# Patient Record
Sex: Male | Born: 1960 | Race: White | Hispanic: No | Smoking: Never smoker
Health system: Southern US, Community
[De-identification: ages and names within clinical notes are randomized; demographics above are authoritative.]

## PROBLEM LIST (undated history)

## (undated) DIAGNOSIS — E119 Type 2 diabetes mellitus without complications: Secondary | ICD-10-CM

## (undated) DIAGNOSIS — I509 Heart failure, unspecified: Secondary | ICD-10-CM

## (undated) DIAGNOSIS — M1990 Unspecified osteoarthritis, unspecified site: Secondary | ICD-10-CM

## (undated) DIAGNOSIS — I10 Essential (primary) hypertension: Secondary | ICD-10-CM

## (undated) HISTORY — PX: SPLENECTOMY, TOTAL: SHX788

## (undated) HISTORY — PX: TONSILLECTOMY: SUR1361

---

## 2018-02-04 DIAGNOSIS — M75100 Unspecified rotator cuff tear or rupture of unspecified shoulder, not specified as traumatic: Secondary | ICD-10-CM | POA: Insufficient documentation

## 2018-02-04 DIAGNOSIS — M25519 Pain in unspecified shoulder: Secondary | ICD-10-CM | POA: Insufficient documentation

## 2018-02-27 DIAGNOSIS — M5412 Radiculopathy, cervical region: Secondary | ICD-10-CM | POA: Insufficient documentation

## 2019-05-07 ENCOUNTER — Emergency Department: Payer: Medicare Other

## 2019-05-07 ENCOUNTER — Other Ambulatory Visit: Payer: Self-pay

## 2019-05-07 ENCOUNTER — Encounter: Payer: Self-pay | Admitting: Emergency Medicine

## 2019-05-07 ENCOUNTER — Emergency Department
Admission: EM | Admit: 2019-05-07 | Discharge: 2019-05-07 | Disposition: A | Discharge status: Home / Self Care | Payer: Medicare Other | Attending: Emergency Medicine | Admitting: Emergency Medicine

## 2019-05-07 DIAGNOSIS — R1030 Lower abdominal pain, unspecified: Secondary | ICD-10-CM | POA: Insufficient documentation

## 2019-05-07 DIAGNOSIS — R112 Nausea with vomiting, unspecified: Secondary | ICD-10-CM | POA: Diagnosis present

## 2019-05-07 DIAGNOSIS — R1013 Epigastric pain: Secondary | ICD-10-CM | POA: Diagnosis not present

## 2019-05-07 DIAGNOSIS — R1084 Generalized abdominal pain: Secondary | ICD-10-CM

## 2019-05-07 DIAGNOSIS — I110 Hypertensive heart disease with heart failure: Secondary | ICD-10-CM | POA: Diagnosis not present

## 2019-05-07 DIAGNOSIS — D7389 Other diseases of spleen: Secondary | ICD-10-CM

## 2019-05-07 DIAGNOSIS — I509 Heart failure, unspecified: Secondary | ICD-10-CM | POA: Insufficient documentation

## 2019-05-07 DIAGNOSIS — R1032 Left lower quadrant pain: Secondary | ICD-10-CM | POA: Diagnosis not present

## 2019-05-07 HISTORY — DX: Type 2 diabetes mellitus without complications: E11.9

## 2019-05-07 HISTORY — DX: Heart failure, unspecified: I50.9

## 2019-05-07 HISTORY — DX: Essential (primary) hypertension: I10

## 2019-05-07 LAB — COMPREHENSIVE METABOLIC PANEL
ALT: 14 U/L (ref 0–44)
AST: 19 U/L (ref 15–41)
Albumin: 3.9 g/dL (ref 3.5–5.0)
Alkaline Phosphatase: 101 U/L (ref 38–126)
Anion gap: 9 (ref 5–15)
BUN: 13 mg/dL (ref 6–20)
CO2: 26 mmol/L (ref 22–32)
Calcium: 8.6 mg/dL — ABNORMAL LOW (ref 8.9–10.3)
Chloride: 98 mmol/L (ref 98–111)
Creatinine, Ser: 0.98 mg/dL (ref 0.61–1.24)
GFR calc Af Amer: >60 mL/min (ref >60)
GFR calc non Af Amer: >60 mL/min (ref >60)
Glucose, Bld: 248 mg/dL — ABNORMAL HIGH (ref 70–99)
Potassium: 3.6 mmol/L (ref 3.5–5.1)
Sodium: 133 mmol/L — ABNORMAL LOW (ref 135–145)
Total Bilirubin: 1.3 mg/dL — ABNORMAL HIGH (ref 0.3–1.2)
Total Protein: 7.6 g/dL (ref 6.5–8.1)

## 2019-05-07 LAB — CBC WITH DIFFERENTIAL/PLATELET
Abs Immature Granulocytes: 0.02 10*3/uL (ref 0.00–0.07)
Basophils Absolute: 0 10*3/uL (ref 0.0–0.1)
Basophils Relative: 0 %
Eosinophils Absolute: 0 10*3/uL (ref 0.0–0.5)
Eosinophils Relative: 0 %
HCT: 44 % (ref 39.0–52.0)
Hemoglobin: 14.8 g/dL (ref 13.0–17.0)
Immature Granulocytes: 0 %
Lymphocytes Relative: 10 %
Lymphs Abs: 1 10*3/uL (ref 0.7–4.0)
MCH: 30.9 pg (ref 26.0–34.0)
MCHC: 33.6 g/dL (ref 30.0–36.0)
MCV: 91.9 fL (ref 80.0–100.0)
Monocytes Absolute: 0.5 10*3/uL (ref 0.1–1.0)
Monocytes Relative: 5 %
Neutro Abs: 7.9 10*3/uL — ABNORMAL HIGH (ref 1.7–7.7)
Neutrophils Relative %: 85 %
Platelets: 278 10*3/uL (ref 150–400)
RBC: 4.79 MIL/uL (ref 4.22–5.81)
RDW: 12.8 % (ref 11.5–15.5)
WBC: 9.4 10*3/uL (ref 4.0–10.5)
nRBC: 0 % (ref 0.0–0.2)

## 2019-05-07 LAB — BRAIN NATRIURETIC PEPTIDE: B Natriuretic Peptide: 438 pg/mL — ABNORMAL HIGH (ref 0.0–100.0)

## 2019-05-07 LAB — TROPONIN I: Troponin I: <0.03 ng/mL (ref <0.03)

## 2019-05-07 LAB — LIPASE, BLOOD: Lipase: 44 U/L (ref 11–51)

## 2019-05-07 MED ORDER — IOHEXOL 300 MG/ML  SOLN
100.0000 mL | Freq: Once | INTRAMUSCULAR | Status: AC | PRN
  Administered 2019-05-07: 100 mL via INTRAVENOUS

## 2019-05-07 MED ORDER — SODIUM CHLORIDE 0.9 % IV BOLUS
1000.0000 mL | Freq: Once | INTRAVENOUS | Status: AC
  Administered 2019-05-07: 1000 mL via INTRAVENOUS

## 2019-05-07 MED ORDER — IOHEXOL 240 MG/ML SOLN
50.0000 mL | Freq: Once | INTRAMUSCULAR | Status: AC
  Administered 2019-05-07: 50 mL via INTRAVENOUS

## 2019-05-07 MED ORDER — ONDANSETRON HCL 4 MG/2ML IJ SOLN
4.0000 mg | Freq: Once | INTRAMUSCULAR | Status: AC
  Administered 2019-05-07: 4 mg via INTRAVENOUS
  Filled 2019-05-07: qty 2

## 2019-05-07 MED ORDER — OXYCODONE-ACETAMINOPHEN 5-325 MG PO TABS: 1.0000 | ORAL_TABLET | ORAL | 0 refills | Status: DC | PRN

## 2019-05-07 MED ORDER — ONDANSETRON 4 MG PO TBDP: 4.0000 mg | ORAL_TABLET | Freq: Three times a day (TID) | ORAL | 0 refills | Status: DC | PRN

## 2019-05-07 MED ORDER — MORPHINE SULFATE (PF) 4 MG/ML IV SOLN
4.0000 mg | Freq: Once | INTRAVENOUS | Status: AC
  Administered 2019-05-07: 4 mg via INTRAVENOUS
  Filled 2019-05-07: qty 1

## 2019-05-07 NOTE — ED Triage Notes (Signed)
Patient ambulatory to triage with steady gait, without difficulty or distress noted; pt reports since last night having mid CP, generalized abd pain accomp by N/V

## 2019-05-07 NOTE — Discharge Instructions (Addendum)
You have been seen in the emergency department for abdominal pain nausea and vomiting.  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  Please take your pain and nausea medication as needed, as prescribed.  Return to the emergency department for any worsening abdominal pain, development of fever, or any other symptom personally concerning to yourself.

## 2019-05-07 NOTE — ED Provider Notes (Signed)
[facility] Emergency Department Provider Note  Time seen: 8:43 AM  I have reviewed the triage vital signs and the nursing notes.   HISTORY  Chief Complaint Chest Pain and Abdominal Pain   HPI Micheal Hardin is a 58 y.o. male with a past medical history of CHF, hypertension, presents to the emergency department for nausea vomiting.  According to the patient at around 11 PM last night he became very nauseated and began with frequent episodes of vomiting.  Patient states intermittent vomiting throughout the night, states mild discomfort in his mid chest and upper abdomen which she relates to vomiting.  Denies any shortness of breath.  Patient states he was diagnosed with pneumonia approximately 1 week ago and completed a course of antibiotics, states this has improved.  States he was also swab for coronavirus 1 week ago which was negative.  Patient states moderate upper abdominal pain with mild lower abdominal pain.  States a small bowel movement this morning.  Denies any diarrhea.  Denies any fever.  Past Medical History:  Diagnosis Date  . CHF (congestive heart failure) ([place])   . Hypertension     There are no active problems to display for this patient.    Prior to Admission medications   Not on File    No Known Allergies  No family history on file.  Social History Social History   Tobacco Use  . Smoking status: Never Smoker  . Smokeless tobacco: Never Used  Substance Use Topics  . Alcohol use: Not on file  . Drug use: Not on file    Review of Systems Constitutional: Negative for fever. ENT: Negative for recent illness/congestion Cardiovascular: Mild central chest discomfort which he relates to vomiting Respiratory: Negative for shortness of breath.  Mild cough, recently diagnosed with pneumonia. Gastrointestinal: Upper and lower abdominal pain.  Positive for nausea vomiting.  Negative for diarrhea. Genitourinary: Negative for urinary  compaints Musculoskeletal: Negative for musculoskeletal complaints Skin: Negative for skin complaints  Neurological: Negative for headache All other ROS negative  ____________________________________________   PHYSICAL EXAM:  VITAL SIGNS: ED Triage Vitals  Enc Vitals Group     BP 05/07/19 0604 (!) 143/101     Pulse Rate 05/07/19 0604 (!) 116     Resp 05/07/19 0604 (!) 27     Temp 05/07/19 0604 97.6 F (36.4 C)     Temp src --      SpO2 05/07/19 0604 98 %     Weight 05/07/19 0555 167 lb (75.8 kg)     Height 05/07/19 0555 5\' 7"  (1.702 m)     Head Circumference --      Peak Flow --      Pain Score 05/07/19 0555 8     Pain Loc --      Pain Edu? --      Excl. in [place]? --    Constitutional: Alert and oriented. Well appearing and in no distress. Eyes: Normal exam ENT      Head: Normocephalic and atraumatic.      Mouth/Throat: Mucous membranes are moist. Cardiovascular: Normal rate, regular rhythm. No murmur Respiratory: Normal respiratory effort without tachypnea nor retractions. Breath sounds are clear  Gastrointestinal: Soft, moderate epigastric tenderness palpation with mild left lower quadrant and suprapubic tenderness.  No rebound guarding or distention. Musculoskeletal: Nontender with normal range of motion in all extremities.  Neurologic:  Normal speech and language. No gross focal neurologic deficits  Skin:  Skin is warm, dry and intact.  Psychiatric: Mood and affect are normal.   ____________________________________________    EKG  EKG viewed and interpreted by myself shows sinus tachycardia at 115 bpm with a slightly widened QRS, left axis deviation, largely normal intervals nonspecific ST changes.  ____________________________________________    RADIOLOGY   IMPRESSION:  1. Small bilateral pleural effusions and septal thickening in the  visualized lung bases suggesting CHF.  2. 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 Comparison with prior outside  studies if available would be useful to confirm. Alternatively,  Tc-55m sulfur colloid scintigraphy can also be diagnostic.  3. 1.5 cm left adrenal nodule, possibly adenoma but nonspecific.   ____________________________________________   INITIAL IMPRESSION / ASSESSMENT AND PLAN / ED COURSE  Pertinent labs & imaging results that were available during my care of the patient were reviewed by me and considered in my medical decision making (see chart for details).   Patient presents emergency department for abdominal discomfort, mild chest discomfort and nausea vomiting since 11 PM last night.  Differential is quite broad but would include gastritis, gastroenteritis, enteritis, ACS, pancreatitis, colitis or diverticulitis, SBO.  We will treat pain, nausea, IV hydrate.  Patient's lab work is largely nonrevealing, negative troponin.  Lipase has been added.  CT pending.  CT shows possible splenosis which could be responsible for the patient's abdominal pain.  We will have the patient follow-up with his PCP regarding the results.  Overall patient appears well, pain is improved.  We will discharge home pain medication.  Patient agreeable to plan of care.  Discussed my normal abdominal pain return precautions.  Micheal Hardin was evaluated in Emergency Department on 05/07/2019 for the symptoms described in the history of present illness. He was evaluated in the context of the global COVID-19 pandemic, which necessitated consideration that the patient might be at risk for infection with the SARS-CoV-2 virus that causes COVID-19. Institutional protocols and algorithms that pertain to the evaluation of patients at risk for COVID-19 are in a state of rapid change based on information released by regulatory bodies including the CDC and federal and state organizations. These policies and algorithms were followed during the patient's care in the  ED.  ____________________________________________   FINAL CLINICAL IMPRESSION(S) / ED DIAGNOSES  Abdominal pain Nausea vomiting   Minna Antis, MD 05/07/19 1120

## 2019-05-08 ENCOUNTER — Other Ambulatory Visit: Payer: Self-pay

## 2019-05-08 ENCOUNTER — Observation Stay
Admission: EM | Admit: 2019-05-08 | Discharge: 2019-05-10 | Disposition: A | Discharge status: Home / Self Care | Payer: Medicare Other | Attending: Internal Medicine | Admitting: Internal Medicine

## 2019-05-08 ENCOUNTER — Emergency Department: Payer: Medicare Other

## 2019-05-08 DIAGNOSIS — F329 Major depressive disorder, single episode, unspecified: Secondary | ICD-10-CM | POA: Insufficient documentation

## 2019-05-08 DIAGNOSIS — G629 Polyneuropathy, unspecified: Secondary | ICD-10-CM | POA: Diagnosis not present

## 2019-05-08 DIAGNOSIS — I081 Rheumatic disorders of both mitral and tricuspid valves: Secondary | ICD-10-CM | POA: Diagnosis not present

## 2019-05-08 DIAGNOSIS — M549 Dorsalgia, unspecified: Secondary | ICD-10-CM | POA: Diagnosis not present

## 2019-05-08 DIAGNOSIS — R112 Nausea with vomiting, unspecified: Secondary | ICD-10-CM | POA: Diagnosis not present

## 2019-05-08 DIAGNOSIS — N400 Enlarged prostate without lower urinary tract symptoms: Secondary | ICD-10-CM | POA: Diagnosis not present

## 2019-05-08 DIAGNOSIS — I5022 Chronic systolic (congestive) heart failure: Secondary | ICD-10-CM | POA: Diagnosis not present

## 2019-05-08 DIAGNOSIS — G8929 Other chronic pain: Secondary | ICD-10-CM | POA: Insufficient documentation

## 2019-05-08 DIAGNOSIS — Z9081 Acquired absence of spleen: Secondary | ICD-10-CM | POA: Insufficient documentation

## 2019-05-08 DIAGNOSIS — J90 Pleural effusion, not elsewhere classified: Secondary | ICD-10-CM | POA: Diagnosis not present

## 2019-05-08 DIAGNOSIS — I110 Hypertensive heart disease with heart failure: Secondary | ICD-10-CM | POA: Diagnosis not present

## 2019-05-08 DIAGNOSIS — Z79899 Other long term (current) drug therapy: Secondary | ICD-10-CM | POA: Diagnosis not present

## 2019-05-08 DIAGNOSIS — E119 Type 2 diabetes mellitus without complications: Secondary | ICD-10-CM | POA: Insufficient documentation

## 2019-05-08 DIAGNOSIS — K219 Gastro-esophageal reflux disease without esophagitis: Secondary | ICD-10-CM | POA: Insufficient documentation

## 2019-05-08 DIAGNOSIS — Z8249 Family history of ischemic heart disease and other diseases of the circulatory system: Secondary | ICD-10-CM | POA: Diagnosis not present

## 2019-05-08 DIAGNOSIS — Z1159 Encounter for screening for other viral diseases: Secondary | ICD-10-CM | POA: Diagnosis not present

## 2019-05-08 LAB — CBC
HCT: 44.7 % (ref 39.0–52.0)
Hemoglobin: 15 g/dL (ref 13.0–17.0)
MCH: 30.5 pg (ref 26.0–34.0)
MCHC: 33.6 g/dL (ref 30.0–36.0)
MCV: 91 fL (ref 80.0–100.0)
Platelets: 265 10*3/uL (ref 150–400)
RBC: 4.91 MIL/uL (ref 4.22–5.81)
RDW: 12.8 % (ref 11.5–15.5)
WBC: 8 10*3/uL (ref 4.0–10.5)
nRBC: 0 % (ref 0.0–0.2)

## 2019-05-08 LAB — COMPREHENSIVE METABOLIC PANEL
ALT: 14 U/L (ref 0–44)
AST: 24 U/L (ref 15–41)
Albumin: 3.9 g/dL (ref 3.5–5.0)
Alkaline Phosphatase: 99 U/L (ref 38–126)
Anion gap: 12 (ref 5–15)
BUN: 10 mg/dL (ref 6–20)
CO2: 23 mmol/L (ref 22–32)
Calcium: 8.9 mg/dL (ref 8.9–10.3)
Chloride: 100 mmol/L (ref 98–111)
Creatinine, Ser: 0.91 mg/dL (ref 0.61–1.24)
GFR calc Af Amer: >60 mL/min (ref >60)
GFR calc non Af Amer: >60 mL/min (ref >60)
Glucose, Bld: 210 mg/dL — ABNORMAL HIGH (ref 70–99)
Potassium: 4 mmol/L (ref 3.5–5.1)
Sodium: 135 mmol/L (ref 135–145)
Total Bilirubin: 1.8 mg/dL — ABNORMAL HIGH (ref 0.3–1.2)
Total Protein: 7.6 g/dL (ref 6.5–8.1)

## 2019-05-08 LAB — LIPASE, BLOOD: Lipase: 32 U/L (ref 11–51)

## 2019-05-08 LAB — URINALYSIS, COMPLETE (UACMP) WITH MICROSCOPIC
Bacteria, UA: NONE SEEN
Bilirubin Urine: NEGATIVE
Glucose, UA: 50 mg/dL — AB
Hgb urine dipstick: NEGATIVE
Ketones, ur: 5 mg/dL — AB
Leukocytes,Ua: NEGATIVE
Nitrite: NEGATIVE
Protein, ur: NEGATIVE mg/dL
Specific Gravity, Urine: 1.008 (ref 1.005–1.030)
Squamous Epithelial / HPF: NONE SEEN (ref 0–5)
pH: 7 (ref 5.0–8.0)

## 2019-05-08 MED ORDER — ACETAMINOPHEN 325 MG PO TABS: 650.0000 mg | ORAL_TABLET | Freq: Four times a day (QID) | ORAL | Status: DC | PRN

## 2019-05-08 MED ORDER — OXYCODONE-ACETAMINOPHEN 5-325 MG PO TABS: 1.0000 | ORAL_TABLET | ORAL | Status: DC | PRN

## 2019-05-08 MED ORDER — FUROSEMIDE 40 MG PO TABS
80.0000 mg | ORAL_TABLET | Freq: Every day | ORAL | Status: DC
  Administered 2019-05-09 – 2019-05-10 (×2): 80 mg via ORAL
  Filled 2019-05-08 (×2): qty 2

## 2019-05-08 MED ORDER — ACETAMINOPHEN 650 MG RE SUPP
650.0000 mg | Freq: Four times a day (QID) | RECTAL | Status: DC | PRN
  Filled 2019-05-08: qty 1

## 2019-05-08 MED ORDER — HYDROXYZINE HCL 50 MG PO TABS
25.0000 mg | ORAL_TABLET | Freq: Three times a day (TID) | ORAL | Status: DC
  Administered 2019-05-09 – 2019-05-10 (×6): 25 mg via ORAL
  Filled 2019-05-08 (×8): qty 1

## 2019-05-08 MED ORDER — ONDANSETRON HCL 4 MG/2ML IJ SOLN
4.0000 mg | Freq: Once | INTRAMUSCULAR | Status: AC
  Administered 2019-05-08: 4 mg via INTRAVENOUS
  Filled 2019-05-08: qty 2

## 2019-05-08 MED ORDER — SODIUM CHLORIDE 0.9 % IV BOLUS
250.0000 mL | Freq: Once | INTRAVENOUS | Status: AC
  Administered 2019-05-08: 250 mL via INTRAVENOUS

## 2019-05-08 MED ORDER — SODIUM CHLORIDE 0.9 % IV BOLUS
500.0000 mL | Freq: Once | INTRAVENOUS | Status: AC
  Administered 2019-05-08: 500 mL via INTRAVENOUS

## 2019-05-08 MED ORDER — METOCLOPRAMIDE HCL 5 MG/ML IJ SOLN
5.0000 mg | Freq: Four times a day (QID) | INTRAMUSCULAR | Status: DC
  Administered 2019-05-09 – 2019-05-10 (×7): 5 mg via INTRAVENOUS
  Filled 2019-05-08 (×7): qty 2

## 2019-05-08 MED ORDER — ENOXAPARIN SODIUM 40 MG/0.4ML ~~LOC~~ SOLN
40.0000 mg | SUBCUTANEOUS | Status: DC
  Administered 2019-05-09 – 2019-05-10 (×2): 40 mg via SUBCUTANEOUS
  Filled 2019-05-08 (×2): qty 0.4

## 2019-05-08 MED ORDER — MAGNESIUM HYDROXIDE 400 MG/5ML PO SUSP: 30.0000 mL | Freq: Every day | ORAL | Status: DC | PRN

## 2019-05-08 MED ORDER — ONDANSETRON 4 MG PO TBDP: 4.0000 mg | ORAL_TABLET | Freq: Three times a day (TID) | ORAL | Status: DC | PRN

## 2019-05-08 MED ORDER — TRAZODONE HCL 50 MG PO TABS
25.0000 mg | ORAL_TABLET | Freq: Every evening | ORAL | Status: DC | PRN
  Filled 2019-05-08: qty 0.5

## 2019-05-08 MED ORDER — PROMETHAZINE HCL 25 MG/ML IJ SOLN
6.2500 mg | Freq: Once | INTRAMUSCULAR | Status: AC
  Administered 2019-05-08: 6.25 mg via INTRAMUSCULAR
  Filled 2019-05-08: qty 1

## 2019-05-08 MED ORDER — ONDANSETRON HCL 4 MG/2ML IJ SOLN: 4.0000 mg | Freq: Four times a day (QID) | INTRAMUSCULAR | Status: DC | PRN

## 2019-05-08 MED ORDER — ESCITALOPRAM OXALATE 10 MG PO TABS
10.0000 mg | ORAL_TABLET | Freq: Every day | ORAL | Status: DC
  Administered 2019-05-09 – 2019-05-10 (×2): 10 mg via ORAL
  Filled 2019-05-08 (×2): qty 1

## 2019-05-08 MED ORDER — ONDANSETRON HCL 4 MG PO TABS
4.0000 mg | ORAL_TABLET | Freq: Four times a day (QID) | ORAL | Status: DC | PRN
  Administered 2019-05-09: 4 mg via ORAL
  Filled 2019-05-08 (×2): qty 1

## 2019-05-08 NOTE — H&P (Signed)
[place] [facility] [place] NAME: Micheal Hardin    MR#:  [id number]  DATE OF BIRTH:  1961-04-25  DATE OF ADMISSION:  05/08/2019  PRIMARY CARE PHYSICIAN: Patient, No Pcp Per   REQUESTING/REFERRING PHYSICIAN: Delman Kitten, MD  CHIEF COMPLAINT:   Chief Complaint  Patient presents with   Nausea   Emesis    HISTORY OF PRESENT ILLNESS:  Micheal Hardin  is a 58 y.o. Caucasian male with a known history of type diabetes mellitus, hypertension CHF and depression, who presented to the emergency room with acute onset of intractable nausea and vomiting since Thursday.  This is a second visit in the last 24 hours to this ER.  He admitted to epigastric abdominal pain and denied any bilious vomitus or hematemesis.  No diarrhea.  No fever or chills.  He was recently treated for pneumonia about a week ago his dyspnea and cough have been better.  He denies any worsening lower extremity edema.  He admitted to occasional paroxysmal nocturnal dyspnea but denied any orthopnea.  His abdominal pelvic CT scan yesterday showed small bilateral pleural effusion suggestive of CHF as well as splenosis as he is status post splenectomy and 1.5 cm left adrenal nodule likely adenoma that is nonspecific.  When he came to the ER tonight, his blood pressure was 144/104 with a pulse 112 respiratory 26 pulse ox 99% on room air.  His temperature was 97.6.  Labs revealed blood glucose of 210 and lipase of 32 with total bili of 1.8 and otherwise normal LFTs.  CBC was within normal.  His urinalysis was unremarkable except for glucose of 50 and ketones of 5. Two-view abdomen x-ray revealed nonobstructive bowel gas pattern with contrast in the colon and small left pleural effusion.  EKG showed sinus tachycardia with rate of 112 with poor R wave progression and suspected left bundle branch block.  The patient was given 4 mg of IV Zofran and 6.25 mg of IM Phenergan twice in addition to 750 mL  IV normal saline bolus.  He failed p.o. challenge.  He will be admitted to an observation medical bed for further evaluation and management. PAST MEDICAL HISTORY:   Past Medical History:  Diagnosis Date   CHF (congestive heart failure) ([facility])    Diabetes mellitus without complication ([facility])    Hypertension   Depression, GERD, chronic back pain and peripheral neuropathy  PAST SURGICAL HISTORY:   Past Surgical History:  Procedure Laterality Date   SPLENECTOMY, TOTAL     TONSILLECTOMY    Cardiac catheterization  SOCIAL HISTORY:   Social History   Tobacco Use   Smoking status: Never Smoker   Smokeless tobacco: Never Used  Substance Use Topics   Alcohol use: Not on file    FAMILY HISTORY:  Positive for diabetes mellitus in his father and liver cancer in his mother as well as heart failure in his brother.  DRUG ALLERGIES:  No Known Allergies  REVIEW OF SYSTEMS:   ROS As per history of present illness. All pertinent systems were reviewed above. Constitutional,  HEENT, cardiovascular, respiratory, GI, GU, musculoskeletal, neuro, psychiatric, endocrine,  integumentary and hematologic systems were reviewed and are otherwise  negative/unremarkable except for positive findings mentioned above in the HPI.   MEDICATIONS AT HOME:   Prior to Admission medications   Medication Sig Start Date End Date Taking? Authorizing Provider  escitalopram (LEXAPRO) 10 MG tablet Take 10 mg by mouth daily. 04/19/19   [provider]  furosemide (LASIX) 40 MG tablet Take 80 mg by mouth daily.    [provider]  hydrOXYzine (VISTARIL) 25 MG capsule Take 25 mg by mouth 3 (three) times daily. 05/05/19   [provider]  ondansetron (ZOFRAN ODT) 4 MG disintegrating tablet Take 1 tablet (4 mg total) by mouth every 8 (eight) hours as needed for nausea or vomiting. 05/07/19   Minna AntisPaduchowski, Kevin, MD  oxyCODONE-acetaminophen (PERCOCET) 5-325 MG tablet Take 1 tablet by mouth  every 4 (four) hours as needed for severe pain. 05/07/19   Minna AntisPaduchowski, Kevin, MD      VITAL SIGNS:  Blood pressure (!) 139/117, pulse (!) 112, temperature 98 F (36.7 C), temperature source Oral, resp. rate (!) 23, height 5\' 7"  (1.702 m), weight 75 kg, SpO2 95 %.  PHYSICAL EXAMINATION:  Physical Exam  GENERAL:  58 y.o.-year-old Caucasian patient lying in the bed with no acute distress.  EYES: Pupils equal, round, reactive to light and accommodation. No scleral icterus. Extraocular muscles intact.  HEENT: Head atraumatic, normocephalic. Oropharynx and nasopharynx clear.  NECK:  Supple, no jugular venous distention. No thyroid enlargement, no tenderness.  LUNGS: Diminished basal breath sounds bilaterally, no wheezing, rales,rhonchi or crepitation. No use of accessory muscles of respiration.  CARDIOVASCULAR: Regular rate and rhythm, S1, S2 normal. No murmurs, rubs, or gallops.  ABDOMEN: Soft, nondistended, nontender. Bowel sounds present. No organomegaly or mass.  EXTREMITIES: Trace bilateral lower extremity pitting edema, with no cyanosis, or clubbing.  NEUROLOGIC: Cranial nerves II through XII are intact. Muscle strength 5/5 in all extremities. Sensation intact. Gait not checked.  PSYCHIATRIC: The patient is alert and oriented x 3.  Normal affect and good eye contact. SKIN: No obvious rash, lesion, or ulcer.   LABORATORY PANEL:   CBC Recent Labs  Lab 05/08/19 1834  WBC 8.0  HGB 15.0  HCT 44.7  PLT 265   ------------------------------------------------------------------------------------------------------------------  Chemistries  Recent Labs  Lab 05/08/19 1834  NA 135  K 4.0  CL 100  CO2 23  GLUCOSE 210*  BUN 10  CREATININE 0.91  CALCIUM 8.9  AST 24  ALT 14  ALKPHOS 99  BILITOT 1.8*   ------------------------------------------------------------------------------------------------------------------  Cardiac Enzymes Recent Labs  Lab 05/07/19 0609  TROPONINI  <0.03   ------------------------------------------------------------------------------------------------------------------  RADIOLOGY:  Ct Abdomen Pelvis W Contrast  Result Date: 05/07/2019 CLINICAL DATA:  Patient ambulatory to triage with steady gait, without difficulty or distress noted; pt reports since last night having mid CP, generalized abd pain accomp by N/V. History of splenectomy EXAM: CT ABDOMEN AND PELVIS WITH CONTRAST TECHNIQUE: Multidetector CT imaging of the abdomen and pelvis was performed using the standard protocol following bolus administration of intravenous contrast. CONTRAST:  100mL OMNIPAQUE IOHEXOL 300 MG/ML  SOLN COMPARISON:  None. FINDINGS: Lower chest: Small bilateral pleural effusions. Septal thickening in the visualized lung bases. Hepatobiliary: No focal liver abnormality is seen. No gallstones, gallbladder wall thickening, or biliary dilatation. Pancreas: Unremarkable. No pancreatic ductal dilatation or surrounding inflammatory changes. Spleen: Surgically absent. However, there are multiple homogeneously enhancing somewhat lobular abdominal and right pelvic peritoneal masses measuring from 1.3 to 7.3 cm in diameter suggesting posttraumatic or postop heterotopic autotransplantation of splenic tissue. Adrenals/Urinary Tract: 1.5 cm low-attenuation left adrenal nodule, possibly adenoma but nonspecific. Right adrenal unremarkable. No renal mass or hydronephrosis. Urinary bladder physiologically distended. Stomach/Bowel: Stomach is nondilated. Small bowel decompressed. The colon is nondilated, unremarkable. Vascular/Lymphatic: Mild scattered aortoiliac calcified atheromatous plaque without aneurysm or evident stenosis. No abdominal or pelvic adenopathy. Reproductive: Prostate  enlargement with central coarse calcifications. Other: No ascites. No free air. Musculoskeletal: Advanced spondylitic changes in the lower lumbar spine. No fracture or worrisome bone lesion. IMPRESSION: 1. Small  bilateral pleural effusions and septal thickening in the visualized lung bases suggesting CHF. 2. Multiple homogeneously enhancing peritoneal masses post splenectomy, suggesting splenosis. Comparison with prior outside studies if available would be useful to confirm. Alternatively, Tc-19m sulfur colloid scintigraphy can also be diagnostic. 3. 1.5 cm left adrenal nodule, possibly adenoma but nonspecific. Electronically Signed   By: Corlis Leak M.D.   On: 05/07/2019 10:58   Dg Abd 2 Views  Result Date: 05/08/2019 CLINICAL DATA:  Nausea vomiting and fatigue EXAM: ABDOMEN - 2 VIEW COMPARISON:  CT 05/07/2019 FINDINGS: Linear atelectasis left base. Small left effusion. Enteral contrast within the colon. Nonobstructed bowel-gas pattern. IMPRESSION: 1. Nonobstructed bowel gas pattern with contrast in the colon 2. Small left effusion Electronically Signed   By: Jasmine Pang M.D.   On: 05/08/2019 19:45      IMPRESSION AND PLAN:   1.  Nausea and vomiting.  This could be related to mild gastritis possibly viral.  The patient will be admitted to an elevation medical bed.  He will be hydrated with IV normal saline.  Will place on clear liquids.  PRN antiemetics will be provided and will place him on scheduled IV Reglan given his history of type 2 diabetes mellitus and the possibility of gastroparesis in the differential diagnosis.  IV PPI therapy will be provided.  2.  Chronic CHF.  He does not have clear physical signs of acute CHF.  For now we will continue his Lasix and avoid IV fluids.  Given his bilateral pleural effusion I will obtain a 2D echo to assess his current EF.  3.  Type II diabetes mellitus.  The patient will be placed on supplemental coverage with NovoLog.  4.  Hypertension.  We will continue his antihypertensives.  5.  Depression.  His Lexapr, doxepin and Wellbutrin ER will be continued.  6.  GERD.  He will be placed on IV PPI therapy.  We will continue his Carafate.  7.  Peripheral  neuropathy.  Lyrica will be resumed.  8.  DVT prophylaxis.  Subcutaneous Lovenox   All the records are reviewed and case discussed with ED provider. The plan of care was discussed in details with the patient (and family). I answered all questions. The patient agreed to proceed with the above mentioned plan. Further management will depend upon hospital course.   CODE STATUS: Full code  TOTAL TIME TAKING CARE OF THIS PATIENT: 50 minutes.    Hannah Beat M.D on 05/08/2019 at 11:54 PM  Pager - [phone]  After 6pm go to [email or web address] - Social research officer, government  [facility]  Office  [phone]  CC: Primary care physician; Patient, No Pcp Per   Note: This dictation was prepared with Dragon dictation along with smaller phrase technology. Any transcriptional errors that result from this process are unintentional.

## 2019-05-08 NOTE — ED Provider Notes (Signed)
[facility] Emergency Department Provider Note   ____________________________________________   First MD Initiated Contact with Patient 05/08/19 1901     (approximate)  I have reviewed the triage vital signs and the nursing notes.   HISTORY  Chief Complaint Nausea and Emesis    HPI Micheal Hardin is a 58 y.o. male with a history of CHF diabetes and recent evaluation for abdominal pain and nausea with vomiting   Patient has been experiencing abdominal pain and nausea for about 2 days.  He was seen at the ER, returned today because he went home and is continued to have vomiting despite taking nausea medication 4 times throughout the day.  He is thrown up about 4 times each time after trying to eat or hold anything on his stomach  He does feel fatigued, little bit dehydrated.  He is in no acute pain just reports he gets really nauseated after he eats and then throws up.  No diarrhea.  No recent bowel movement.  Not keeping much on his stomach.  Denies fluid in his legs or feeling swollen though he does have a history of CHF  No fevers or chills.  No exposure to coronavirus.  Reports ongoing symptoms similar to when he came yesterday  Past Medical History:  Diagnosis Date  . CHF (congestive heart failure) ([place])   . Diabetes mellitus without complication ([place])   . Hypertension     Patient Active Problem List   Diagnosis Date Noted  . Intractable nausea and vomiting 05/08/2019    Past Surgical History:  Procedure Laterality Date  . SPLENECTOMY, TOTAL    . TONSILLECTOMY      Prior to Admission medications   Medication Sig Start Date End Date Taking? Authorizing Provider  escitalopram (LEXAPRO) 10 MG tablet Take 10 mg by mouth daily. 04/19/19   [provider]  furosemide (LASIX) 40 MG tablet Take 80 mg by mouth daily.    [provider]  hydrOXYzine (VISTARIL) 25 MG capsule Take 25 mg by mouth 3 (three) times daily. 05/05/19    [provider]  ondansetron (ZOFRAN ODT) 4 MG disintegrating tablet Take 1 tablet (4 mg total) by mouth every 8 (eight) hours as needed for nausea or vomiting. 05/07/19   Harvest Dark, MD  oxyCODONE-acetaminophen (PERCOCET) 5-325 MG tablet Take 1 tablet by mouth every 4 (four) hours as needed for severe pain. 05/07/19   Harvest Dark, MD    Allergies Patient has no known allergies.  History reviewed. No pertinent family history.  Social History Social History   Tobacco Use  . Smoking status: Never Smoker  . Smokeless tobacco: Never Used  Substance Use Topics  . Alcohol use: Not on file  . Drug use: Not on file    Review of Systems Constitutional: No fever/chills Eyes: No visual changes. ENT: No sore throat. Cardiovascular: Denies chest pain. Respiratory: Denies shortness of breath. Gastrointestinal: See HPI Genitourinary: Negative for dysuria. Musculoskeletal: Negative for back pain. Skin: Negative for rash. Neurological: Negative for headaches, areas of focal weakness or numbness.    ____________________________________________   PHYSICAL EXAM:  VITAL SIGNS: ED Triage Vitals  Enc Vitals Group     BP 05/08/19 1833 (!) 144/104     Pulse Rate 05/08/19 1833 (!) 112     Resp 05/08/19 1833 (!) 26     Temp 05/08/19 1833 97.6 F (36.4 C)     Temp Source 05/08/19 1833 Oral     SpO2 05/08/19 1833 99 %  Weight 05/08/19 1830 165 lb 5.5 oz (75 kg)     Height 05/08/19 1830 5\' 7"  (1.702 m)     Head Circumference --      Peak Flow --      Pain Score 05/08/19 1830 8     Pain Loc --      Pain Edu? --      Excl. in GC? --     Constitutional: Alert and oriented.  He is nauseated.  Mildly ill no acute distress.  He is very pleasant. Eyes: Conjunctivae are normal. Head: Atraumatic. Nose: No congestion/rhinnorhea. Mouth/Throat: Mucous membranes are modestly dry. Neck: No stridor.  Cardiovascular: Normal rate, regular rhythm. Grossly normal heart  sounds.  Good peripheral circulation. Respiratory: Normal respiratory effort.  No retractions. Lungs CTAB. Gastrointestinal: Soft and with some mild tenderness periumbilically without any rebound or guarding.  Mostly reports it makes him feel sick on his stomach to palpate his abdomen.  Soft in all quadrants. No distention. Musculoskeletal: No lower extremity tenderness nor edema. Neurologic:  Normal speech and language. No gross focal neurologic deficits are appreciated.  Skin:  Skin is warm, dry and intact. No rash noted. Psychiatric: Mood and affect are normal. Speech and behavior are normal.  ____________________________________________   LABS (all labs ordered are listed, but only abnormal results are displayed)  Labs Reviewed  COMPREHENSIVE METABOLIC PANEL - Abnormal; Notable for the following components:      Result Value   Glucose, Bld 210 (*)    Total Bilirubin 1.8 (*)    All other components within normal limits  URINALYSIS, COMPLETE (UACMP) WITH MICROSCOPIC - Abnormal; Notable for the following components:   Color, Urine YELLOW (*)    APPearance CLEAR (*)    Glucose, UA 50 (*)    Ketones, ur 5 (*)    All other components within normal limits  NOVEL CORONAVIRUS, NAA (HOSPITAL ORDER, SEND-OUT TO REF LAB)  LIPASE, BLOOD  CBC  HIV ANTIBODY (ROUTINE TESTING W REFLEX)  BASIC METABOLIC PANEL  CBC   ____________________________________________  EKG  Reviewed and and interpreted by me at 1835 Heart rate 110 QRS 120 QTc 510 Sinus tachycardia, left bundle branch block. ____________________________________________  RADIOLOGY  Dg Abd 2 Views  Result Date: 05/08/2019 CLINICAL DATA:  Nausea vomiting and fatigue EXAM: ABDOMEN - 2 VIEW COMPARISON:  CT 05/07/2019 FINDINGS: Linear atelectasis left base. Small left effusion. Enteral contrast within the colon. Nonobstructed bowel-gas pattern. IMPRESSION: 1. Nonobstructed bowel gas pattern with contrast in the colon 2. Small left  effusion Electronically Signed   By: Jasmine Pang M.D.   On: 05/08/2019 19:45     ____________________________________________   PROCEDURES  Procedure(s) performed: None  Procedures  Critical Care performed: No  ____________________________________________   INITIAL IMPRESSION / ASSESSMENT AND PLAN / ED COURSE  Pertinent labs & imaging results that were available during my care of the patient were reviewed by me and considered in my medical decision making (see chart for details).   Differential diagnosis includes but is not limited to, abdominal perforation, aortic dissection, cholecystitis, appendicitis, diverticulitis, colitis, esophagitis/gastritis, kidney stone, pyelonephritis, urinary tract infection, aortic aneurysm. All are considered in decision and treatment plan. Based upon the patient's presentation and risk factors, patient representing with ongoing nausea and vomiting despite use of oral antiemetics at home.  CT scan from yesterday reviewed, will reimage today with plain films to assure no added development of obstructive pathology or ileus.  Treat with antiemetics, IV fluids.    Clinical Course  as of May 08 9  Sat May 08, 2019  2208 Patient reports he is feeling better and would like something to eat and possibly to go home if better.  Reports his nausea is better and the overall feels improved.  Reviewed his lab work with him.  Remains just slightly tachycardic, but given his history of CHF I am reluctant to give a large fluid bolus   [MQ]    Clinical Course User Index [MQ] Sharyn CreamerQuale, Shaine Newmark, MD   ----------------------------------------- 12:08 AM on 05/09/2019 -----------------------------------------  Patient had inability to take by mouth, failed his p.o. challenge got very nauseated afterwards.  Will give additional antiemetic, this is a second ED visit in 24 hours for same and remains nauseated unable to take by mouth, discussed with the patient will admit for  further care and treatment, antiemetics hydration.  Patient in agreement with plan  Case discussed with Janeann MerlAngela Seals nurse practitioner.  Patient has no COVID symptoms denies any exposure to COVID.  Micheal Hardin was evaluated in Emergency Department on 05/09/2019 for the symptoms described in the history of present illness. He was evaluated in the context of the global COVID-19 pandemic, which necessitated consideration that the patient might be at risk for infection with the SARS-CoV-2 virus that causes COVID-19. Institutional protocols and algorithms that pertain to the evaluation of patients at risk for COVID-19 are in a state of rapid change based on information released by regulatory bodies including the CDC and federal and state organizations. These policies and algorithms were followed during the patient's care in the ED. does not live in conjugation care setting   ____________________________________________   FINAL CLINICAL IMPRESSION(S) / ED DIAGNOSES  Final diagnoses:  Intractable nausea and vomiting        Note:  This document was prepared using Dragon voice recognition software and may include unintentional dictation errors       Sharyn CreamerQuale, Deane Wattenbarger, MD 05/09/19 0010

## 2019-05-08 NOTE — ED Triage Notes (Signed)
Pt comes from home with n/v, fatigue, soreness. Pt was tested negative a week ago for corona and dx with pneumonia. Pt reports decreasing SOB bt it is still present. Pt has not been able to keep anything down since Thursday night. Pt was also here yesterday morning. Zofran last took about 1 hour ago

## 2019-05-08 NOTE — ED Notes (Signed)
Pt reports taking a couple of bites and feeling sick again. Pt has not vomited but reports feeling very nauseous again.

## 2019-05-08 NOTE — ED Notes (Signed)
Pt given meal tray for PO challenge. Pt feeling better after medications and willing to try eating.

## 2019-05-09 ENCOUNTER — Observation Stay
Admit: 2019-05-09 | Discharge: 2019-05-09 | Disposition: A | Discharge status: Home / Self Care | Payer: Medicare Other | Attending: Family Medicine | Admitting: Family Medicine

## 2019-05-09 DIAGNOSIS — R112 Nausea with vomiting, unspecified: Secondary | ICD-10-CM | POA: Diagnosis not present

## 2019-05-09 LAB — CBC
HCT: 40.7 % (ref 39.0–52.0)
Hemoglobin: 13.5 g/dL (ref 13.0–17.0)
MCH: 30.3 pg (ref 26.0–34.0)
MCHC: 33.2 g/dL (ref 30.0–36.0)
MCV: 91.3 fL (ref 80.0–100.0)
Platelets: 251 10*3/uL (ref 150–400)
RBC: 4.46 MIL/uL (ref 4.22–5.81)
RDW: 12.7 % (ref 11.5–15.5)
WBC: 8 10*3/uL (ref 4.0–10.5)
nRBC: 0 % (ref 0.0–0.2)

## 2019-05-09 LAB — URINE DRUG SCREEN, QUALITATIVE (ARMC ONLY)
Amphetamines, Ur Screen: NOT DETECTED
Barbiturates, Ur Screen: NOT DETECTED
Benzodiazepine, Ur Scrn: NOT DETECTED
Cannabinoid 50 Ng, Ur ~~LOC~~: NOT DETECTED
Cocaine Metabolite,Ur ~~LOC~~: NOT DETECTED
MDMA (Ecstasy)Ur Screen: NOT DETECTED
Methadone Scn, Ur: NOT DETECTED
Opiate, Ur Screen: NOT DETECTED
Phencyclidine (PCP) Ur S: NOT DETECTED
Tricyclic, Ur Screen: NOT DETECTED

## 2019-05-09 LAB — BASIC METABOLIC PANEL
Anion gap: 11 (ref 5–15)
BUN: 10 mg/dL (ref 6–20)
CO2: 21 mmol/L — ABNORMAL LOW (ref 22–32)
Calcium: 8.5 mg/dL — ABNORMAL LOW (ref 8.9–10.3)
Chloride: 105 mmol/L (ref 98–111)
Creatinine, Ser: 0.88 mg/dL (ref 0.61–1.24)
GFR calc Af Amer: >60 mL/min (ref >60)
GFR calc non Af Amer: >60 mL/min (ref >60)
Glucose, Bld: 173 mg/dL — ABNORMAL HIGH (ref 70–99)
Potassium: 3.4 mmol/L — ABNORMAL LOW (ref 3.5–5.1)
Sodium: 137 mmol/L (ref 135–145)

## 2019-05-09 LAB — ECHOCARDIOGRAM COMPLETE
Height: 67 in
Weight: 2645.52 oz

## 2019-05-09 LAB — GLUCOSE, CAPILLARY
Glucose-Capillary: 143 mg/dL — ABNORMAL HIGH (ref 70–99)
Glucose-Capillary: 164 mg/dL — ABNORMAL HIGH (ref 70–99)
Glucose-Capillary: 157 mg/dL — ABNORMAL HIGH (ref 70–99)
Glucose-Capillary: 171 mg/dL — ABNORMAL HIGH (ref 70–99)

## 2019-05-09 MED ORDER — POTASSIUM CHLORIDE 10 MEQ/100ML IV SOLN
10.0000 meq | INTRAVENOUS | Status: AC
  Administered 2019-05-09 (×2): 10 meq via INTRAVENOUS
  Filled 2019-05-09 (×2): qty 100

## 2019-05-09 MED ORDER — PANTOPRAZOLE SODIUM 40 MG IV SOLR
40.0000 mg | Freq: Two times a day (BID) | INTRAVENOUS | Status: DC
  Administered 2019-05-09 – 2019-05-10 (×4): 40 mg via INTRAVENOUS
  Filled 2019-05-09 (×5): qty 40

## 2019-05-09 MED ORDER — INSULIN ASPART 100 UNIT/ML ~~LOC~~ SOLN
0.0000 [IU] | Freq: Three times a day (TID) | SUBCUTANEOUS | Status: DC
  Administered 2019-05-09: 13:00:00 2 [IU] via SUBCUTANEOUS
  Administered 2019-05-09: 1 [IU] via SUBCUTANEOUS
  Administered 2019-05-09: 2 [IU] via SUBCUTANEOUS
  Administered 2019-05-10: 1 [IU] via SUBCUTANEOUS
  Administered 2019-05-10: 2 [IU] via SUBCUTANEOUS
  Filled 2019-05-09 (×5): qty 1

## 2019-05-09 MED ORDER — PERFLUTREN LIPID MICROSPHERE
1.0000 mL | INTRAVENOUS | Status: AC | PRN
  Administered 2019-05-09: 3 mL via INTRAVENOUS
  Filled 2019-05-09: qty 10

## 2019-05-09 NOTE — ED Notes (Signed)
Pt awake sitting up in bed in no acute distress. Hr improved.

## 2019-05-09 NOTE — Care Management Obs Status (Signed)
[facility] NOTIFICATION   Patient Details  Name: Deitrick Ferreri MRN: [id number] Date of Birth: [birth year]   Medicare Observation Status Notification Given:  Yes    Larin Depaoli A Marleen Moret, RN 05/09/2019, 3:44 PM

## 2019-05-09 NOTE — ED Notes (Signed)
Pt placed in hospital bed for comfort, po fluids provided. Call bell at left side. Pt denies further needs.

## 2019-05-09 NOTE — ED Notes (Signed)
ED TO INPATIENT HANDOFF REPORT  ED Nurse Name and Phone #: [place] [phone]  S Name/Age/Gender Micheal Hardin 58 y.o. male Room/Bed: ED37A/ED37A  Code Status   Code Status: Full Code  Home/SNF/Other Home Patient oriented to: self, place, time and situation Is this baseline? Yes   Triage Complete: Triage complete  Chief Complaint nausea and vomiting  Triage Note Pt comes from home with n/v, fatigue, soreness. Pt was tested negative a week ago for corona and dx with pneumonia. Pt reports decreasing SOB bt it is still present. Pt has not been able to keep anything down since Thursday night. Pt was also here yesterday morning. Zofran last took about 1 hour ago   Allergies No Known Allergies  Level of Care/Admitting Diagnosis ED Disposition    ED Disposition Condition [facility] Area: [place] [[id number]]  Level of Care: Med-Surg [16]  Covid Evaluation: Screening Protocol (No Symptoms)  Diagnosis: Intractable nausea and vomiting [937169]  Admitting Physician: Christel Mormon [[id number]]  Attending Physician: Christel Mormon [[id number]]  PT Class (Do Not Modify): Observation [104]  PT Acc Code (Do Not Modify): Observation [10022]       B Medical/Surgery History Past Medical History:  Diagnosis Date  . CHF (congestive heart failure) ([place])   . Diabetes mellitus without complication ([place])   . Hypertension    Past Surgical History:  Procedure Laterality Date  . SPLENECTOMY, TOTAL    . TONSILLECTOMY       A IV Location/Drains/Wounds Patient Lines/Drains/Airways Status   Active Line/Drains/Airways    Name:   Placement date:   Placement time:   Site:   Days:   Peripheral IV 05/09/19 Right Antecubital   05/09/19    0500    Antecubital   less than 1          Intake/Output Last 24 hours  Intake/Output Summary (Last 24 hours) at 05/09/2019 1154 Last data filed at 05/09/2019 0212 Gross per 24 hour  Intake 250 ml  Output -  Net 250  ml    Labs/Imaging Results for orders placed or performed during the hospital encounter of 05/08/19 (from the past 48 hour(s))  Lipase, blood     Status: None   Collection Time: 05/08/19  6:34 PM  Result Value Ref Range   Lipase 32 11 - 51 U/L    Comment: Performed at [facility], [address] [place], [place] [address]  Comprehensive metabolic panel     Status: Abnormal   Collection Time: 05/08/19  6:34 PM  Result Value Ref Range   Sodium 135 135 - 145 mmol/L   Potassium 4.0 3.5 - 5.1 mmol/L    Comment: HEMOLYSIS AT THIS LEVEL MAY AFFECT RESULT   Chloride 100 98 - 111 mmol/L   CO2 23 22 - 32 mmol/L   Glucose, Bld 210 (H) 70 - 99 mg/dL   BUN 10 6 - 20 mg/dL   Creatinine, Ser 0.91 0.61 - 1.24 mg/dL   Calcium 8.9 8.9 - 10.3 mg/dL   Total Protein 7.6 6.5 - 8.1 g/dL   Albumin 3.9 3.5 - 5.0 g/dL   AST 24 15 - 41 U/L   ALT 14 0 - 44 U/L   Alkaline Phosphatase 99 38 - 126 U/L   Total Bilirubin 1.8 (H) 0.3 - 1.2 mg/dL   GFR calc non Af Amer >60 >60 mL/min   GFR calc Af Amer >60 >60 mL/min   Anion gap 12 5 -  15    Comment: Performed at [facility], [address] [place], [place] [address]  CBC     Status: None   Collection Time: 05/08/19  6:34 PM  Result Value Ref Range   WBC 8.0 4.0 - 10.5 K/uL   RBC 4.91 4.22 - 5.81 MIL/uL   Hemoglobin 15.0 13.0 - 17.0 g/dL   HCT 96.2 95.2 - 84.1 %   MCV 91.0 80.0 - 100.0 fL   MCH 30.5 26.0 - 34.0 pg   MCHC 33.6 30.0 - 36.0 g/dL   RDW 32.4 40.1 - 02.7 %   Platelets 265 150 - 400 K/uL   nRBC 0.0 0.0 - 0.2 %    Comment: Performed at [facility], [address] [place], [place] [address]  Urinalysis, Complete w Microscopic     Status: Abnormal   Collection Time: 05/08/19  6:34 PM  Result Value Ref Range   Color, Urine YELLOW (A) YELLOW   APPearance CLEAR (A) CLEAR   Specific Gravity, Urine 1.008 1.005 - 1.030   pH 7.0 5.0 - 8.0   Glucose, UA 50 (A) NEGATIVE mg/dL   Hgb urine dipstick NEGATIVE NEGATIVE    Bilirubin Urine NEGATIVE NEGATIVE   Ketones, ur 5 (A) NEGATIVE mg/dL   Protein, ur NEGATIVE NEGATIVE mg/dL   Nitrite NEGATIVE NEGATIVE   Leukocytes,Ua NEGATIVE NEGATIVE   WBC, UA 0-5 0 - 5 WBC/hpf   Bacteria, UA NONE SEEN NONE SEEN   Squamous Epithelial / LPF NONE SEEN 0 - 5   Mucus PRESENT     Comment: Performed at [facility], [address] [place], [place] [address]  Basic metabolic panel     Status: Abnormal   Collection Time: 05/09/19  5:01 AM  Result Value Ref Range   Sodium 137 135 - 145 mmol/L   Potassium 3.4 (L) 3.5 - 5.1 mmol/L   Chloride 105 98 - 111 mmol/L   CO2 21 (L) 22 - 32 mmol/L   Glucose, Bld 173 (H) 70 - 99 mg/dL   BUN 10 6 - 20 mg/dL   Creatinine, Ser 7.42 0.61 - 1.24 mg/dL   Calcium 8.5 (L) 8.9 - 10.3 mg/dL   GFR calc non Af Amer >60 >60 mL/min   GFR calc Af Amer >60 >60 mL/min   Anion gap 11 5 - 15    Comment: Performed at [facility], [address] [place], [place] [address]  CBC     Status: None   Collection Time: 05/09/19  5:01 AM  Result Value Ref Range   WBC 8.0 4.0 - 10.5 K/uL   RBC 4.46 4.22 - 5.81 MIL/uL   Hemoglobin 13.5 13.0 - 17.0 g/dL   HCT 87.5 64.3 - 32.9 %   MCV 91.3 80.0 - 100.0 fL   MCH 30.3 26.0 - 34.0 pg   MCHC 33.2 30.0 - 36.0 g/dL   RDW 51.8 84.1 - 66.0 %   Platelets 251 150 - 400 K/uL   nRBC 0.0 0.0 - 0.2 %    Comment: Performed at [facility], [address] [place], [place] [address]  Glucose, capillary     Status: Abnormal   Collection Time: 05/09/19  8:58 AM  Result Value Ref Range   Glucose-Capillary 143 (H) 70 - 99 mg/dL   Dg Abd 2 Views  Result Date: 05/08/2019 CLINICAL DATA:  Nausea vomiting and fatigue EXAM: ABDOMEN - 2 VIEW COMPARISON:  CT 05/07/2019 FINDINGS: Linear atelectasis left base. Small left  effusion. Enteral contrast within the colon. Nonobstructed bowel-gas pattern. IMPRESSION: 1. Nonobstructed bowel gas pattern with contrast in the colon 2. Small left effusion  Electronically Signed   By: Jasmine PangKim  Fujinaga M.D.   On: 05/08/2019 19:45    Pending Labs Unresulted Labs (From admission, onward)    Start     Ordered   05/09/19 1018  Urine Drug Screen, Qualitative ([facility] only)  Once,   STAT     05/09/19 1017   05/09/19 0058  Hemoglobin A1c  Once,   STAT    Comments: To assess prior glycemic control    05/09/19 0058   05/08/19 2355  Novel Coronavirus,NAA,(SEND-OUT TO REF LAB - TAT 24-48 hrs); Hosp Order  (Asymptomatic Patients Labs)  ONCE - STAT,   STAT    Question:  Rule Out  Answer:  Yes   05/08/19 2354   05/08/19 2350  HIV antibody (Routine Testing)  Once,   STAT     05/08/19 2353          Vitals/Pain Today's Vitals   05/09/19 0630 05/09/19 0645 05/09/19 0900 05/09/19 0948  BP: (!) 139/95  (!) 130/96 130/85  Pulse: (!) 102 98 99 99  Resp:    18  Temp:      TempSrc:      SpO2: 100% 100% 99% 99%  Weight:      Height:      PainSc:        Isolation Precautions No active isolations  Medications Medications  oxyCODONE-acetaminophen (PERCOCET/ROXICET) 5-325 MG per tablet 1 tablet (has no administration in time range)  furosemide (LASIX) tablet 80 mg (80 mg Oral Given 05/09/19 0922)  escitalopram (LEXAPRO) tablet 10 mg (10 mg Oral Given 05/09/19 0922)  hydrOXYzine (ATARAX/VISTARIL) tablet 25 mg (25 mg Oral Given 05/09/19 0922)  ondansetron (ZOFRAN-ODT) disintegrating tablet 4 mg (has no administration in time range)  enoxaparin (LOVENOX) injection 40 mg (40 mg Subcutaneous Given 05/09/19 0548)  acetaminophen (TYLENOL) tablet 650 mg (has no administration in time range)    Or  acetaminophen (TYLENOL) suppository 650 mg (has no administration in time range)  traZODone (DESYREL) tablet 25 mg (has no administration in time range)  magnesium hydroxide (MILK OF MAGNESIA) suspension 30 mL (has no administration in time range)  ondansetron (ZOFRAN) tablet 4 mg (has no administration in time range)    Or  ondansetron (ZOFRAN) injection 4 mg (has no  administration in time range)  metoCLOPramide (REGLAN) injection 5 mg (5 mg Intravenous Given 05/09/19 0859)  pantoprazole (PROTONIX) injection 40 mg (40 mg Intravenous Given 05/09/19 0923)  insulin aspart (novoLOG) injection 0-9 Units (1 Units Subcutaneous Given 05/09/19 0923)  potassium chloride 10 mEq in 100 mL IVPB (10 mEq Intravenous New Bag/Given 05/09/19 1117)  sodium chloride 0.9 % bolus 500 mL (500 mLs Intravenous Bolus 05/08/19 1921)  ondansetron (ZOFRAN) injection 4 mg (4 mg Intravenous Given 05/08/19 1920)  promethazine (PHENERGAN) injection 6.25 mg (6.25 mg Intramuscular Given 05/08/19 2022)  promethazine (PHENERGAN) injection 6.25 mg (6.25 mg Intramuscular Given 05/08/19 2338)  sodium chloride 0.9 % bolus 250 mL (0 mLs Intravenous Stopped 05/09/19 0212)    Mobility walks     Focused Assessments    R Recommendations: See Admitting Provider Note  Report given to:   Additional Notes:

## 2019-05-09 NOTE — ED Notes (Signed)
Pt ambulatory to BR without difficulty.

## 2019-05-09 NOTE — ED Notes (Signed)
Pt reports more comfortable changed beds

## 2019-05-09 NOTE — ED Notes (Signed)
Pharmacy called for vistaril dose.

## 2019-05-09 NOTE — ED Notes (Signed)
Report to anna, rn

## 2019-05-09 NOTE — Plan of Care (Signed)
The patient is stable. No falls. On droplet precautions for covid rule out. The patient reports still having nausea. No BM since Thursday.  Problem: Education: Goal: Knowledge of General Education information will improve Description: Including pain rating scale, medication(s)/side effects and non-pharmacologic comfort measures Outcome: Progressing   Problem: Health Behavior/Discharge Planning: Goal: Ability to manage health-related needs will improve Outcome: Progressing   Problem: Clinical Measurements: Goal: Ability to maintain clinical measurements within normal limits will improve Outcome: Progressing Goal: Will remain free from infection Outcome: Progressing Goal: Diagnostic test results will improve Outcome: Progressing Goal: Respiratory complications will improve Outcome: Progressing Goal: Cardiovascular complication will be avoided Outcome: Progressing   Problem: Activity: Goal: Risk for activity intolerance will decrease Outcome: Progressing   Problem: Nutrition: Goal: Adequate nutrition will be maintained Outcome: Progressing   Problem: Coping: Goal: Level of anxiety will decrease Outcome: Progressing   Problem: Elimination: Goal: Will not experience complications related to bowel motility Outcome: Progressing Goal: Will not experience complications related to urinary retention Outcome: Progressing   Problem: Pain Managment: Goal: General experience of comfort will improve Outcome: Progressing   Problem: Safety: Goal: Ability to remain free from injury will improve Outcome: Progressing   Problem: Skin Integrity: Goal: Risk for impaired skin integrity will decrease Outcome: Progressing

## 2019-05-09 NOTE — Progress Notes (Signed)
[facility] - [place] at [facility]   PATIENT NAME: Micheal Hardin    MR#:  [id number]  DATE OF BIRTH:  Jun 04, 1961  SUBJECTIVE:   Patient states he is continuing to have a lot of nausea and dry heaving today.  His vomiting is gotten better.  He denies any diarrhea.  He denies any sick contacts.  No fevers or chills.  REVIEW OF SYSTEMS:  Review of Systems  Constitutional: Negative for chills and fever.  HENT: Negative for congestion and sore throat.   Eyes: Negative for blurred vision and double vision.  Respiratory: Negative for cough and shortness of breath.   Cardiovascular: Negative for chest pain and palpitations.  Gastrointestinal: Positive for nausea. Negative for abdominal pain, diarrhea and vomiting.  Genitourinary: Negative for dysuria and urgency.  Musculoskeletal: Negative for back pain and neck pain.  Neurological: Negative for dizziness and headaches.  Psychiatric/Behavioral: Negative for depression. The patient is not nervous/anxious.     DRUG ALLERGIES:  No Known Allergies VITALS:  Blood pressure 130/85, pulse 99, temperature 97.8 F (36.6 C), temperature source Oral, resp. rate 18, height 5\' 7"  (1.702 m), weight 75 kg, SpO2 99 %. PHYSICAL EXAMINATION:  Physical Exam  GENERAL:  Laying in the bed with no acute distress.  HEENT: Head atraumatic, normocephalic. Pupils equal, round, reactive to light and accommodation. No scleral icterus. Extraocular muscles intact. Oropharynx and nasopharynx clear.  NECK:  Supple, no jugular venous distention. No thyroid enlargement. LUNGS: Lungs are clear to auscultation bilaterally. No wheezes, crackles, rhonchi. No use of accessory muscles of respiration.  CARDIOVASCULAR: RRR, S1, S2 normal. No murmurs, rubs, or gallops.  ABDOMEN: Soft, nontender, nondistended. Bowel sounds present.  EXTREMITIES: No pedal edema, cyanosis, or clubbing.  NEUROLOGIC: CN 2-12 intact, no focal deficits. 5/5 muscle strength  throughout all extremities. Sensation intact throughout. Gait not checked.  PSYCHIATRIC: The patient is alert and oriented x 3.  SKIN: No obvious rash, lesion, or ulcer.  LABORATORY PANEL:  Male CBC Recent Labs  Lab 05/09/19 0501  WBC 8.0  HGB 13.5  HCT 40.7  PLT 251   ------------------------------------------------------------------------------------------------------------------ Chemistries  Recent Labs  Lab 05/08/19 1834 05/09/19 0501  NA 135 137  K 4.0 3.4*  CL 100 105  CO2 23 21*  GLUCOSE 210* 173*  BUN 10 10  CREATININE 0.91 0.88  CALCIUM 8.9 8.5*  AST 24  --   ALT 14  --   ALKPHOS 99  --   BILITOT 1.8*  --    RADIOLOGY:  Dg Abd 2 Views  Result Date: 05/08/2019 CLINICAL DATA:  Nausea vomiting and fatigue EXAM: ABDOMEN - 2 VIEW COMPARISON:  CT 05/07/2019 FINDINGS: Linear atelectasis left base. Small left effusion. Enteral contrast within the colon. Nonobstructed bowel-gas pattern. IMPRESSION: 1. Nonobstructed bowel gas pattern with contrast in the colon 2. Small left effusion Electronically Signed   By: Jasmine Pang M.D.   On: 05/08/2019 19:45   ASSESSMENT AND PLAN:   Nausea/vomiting-likely secondary to gastroenteritis versus gastroparesis -Continue IV PPI -Continue IV antiemetics and IV Reglan -Check UDS  Hypokalemia -Replete and recheck  Chronic CHF- unknown if systolic or diastolic.   Patient appears euvolemic on exam, but CT abdomen/pelvis with small bilateral pleural effusions and BNP is mildly elevated. -Hold on IVFs -ECHO pending  Type II diabetes mellitus -SSI  Hypertension-BP is well controlled -Continue home BP meds  Depression-stable  -Continue Lexapro, doxepin and Wellbutrin ER   GERD -IV PPI -Continue home Carafate  Peripheral neuropathy-stable -  Lyrica will be resumed.  DVT prophylaxis-Lovenox  Plan for likely discharge home tomorrow.  All the records are reviewed and case discussed with Care Management/Social Worker.  Management plans discussed with the patient, family and they are in agreement.  CODE STATUS: Full Code  TOTAL TIME TAKING CARE OF THIS PATIENT: 40 minutes.   More than 50% of the time was spent in counseling/coordination of care: YES  POSSIBLE D/C tomorrow, DEPENDING ON CLINICAL CONDITION.   Berna Spare Mayo M.D on 05/09/2019 at 1:33 PM  Between 7am to 6pm - Pager - [phone]  After 6pm go to [email or web address] - Proofreader  [facility]  Office  [phone]  CC: Primary care physician; Patient, No Pcp Per  Note: This dictation was prepared with Dragon dictation along with smaller phrase technology. Any transcriptional errors that result from this process are unintentional.

## 2019-05-09 NOTE — ED Notes (Signed)
Primary MD at bedside.

## 2019-05-09 NOTE — ED Notes (Signed)
Pt assisted up to commode to have a bowel movement.

## 2019-05-10 DIAGNOSIS — R112 Nausea with vomiting, unspecified: Secondary | ICD-10-CM | POA: Diagnosis not present

## 2019-05-10 LAB — CBC
HCT: 41.6 % (ref 39.0–52.0)
Hemoglobin: 13.8 g/dL (ref 13.0–17.0)
MCH: 30.7 pg (ref 26.0–34.0)
MCHC: 33.2 g/dL (ref 30.0–36.0)
MCV: 92.7 fL (ref 80.0–100.0)
Platelets: 238 10*3/uL (ref 150–400)
RBC: 4.49 MIL/uL (ref 4.22–5.81)
RDW: 12.8 % (ref 11.5–15.5)
WBC: 7.8 10*3/uL (ref 4.0–10.5)
nRBC: 0 % (ref 0.0–0.2)

## 2019-05-10 LAB — BASIC METABOLIC PANEL
Anion gap: 9 (ref 5–15)
BUN: 11 mg/dL (ref 6–20)
CO2: 25 mmol/L (ref 22–32)
Calcium: 8.6 mg/dL — ABNORMAL LOW (ref 8.9–10.3)
Chloride: 104 mmol/L (ref 98–111)
Creatinine, Ser: 0.9 mg/dL (ref 0.61–1.24)
GFR calc Af Amer: >60 mL/min (ref >60)
GFR calc non Af Amer: >60 mL/min (ref >60)
Glucose, Bld: 135 mg/dL — ABNORMAL HIGH (ref 70–99)
Potassium: 3.2 mmol/L — ABNORMAL LOW (ref 3.5–5.1)
Sodium: 138 mmol/L (ref 135–145)

## 2019-05-10 LAB — GLUCOSE, CAPILLARY
Glucose-Capillary: 130 mg/dL — ABNORMAL HIGH (ref 70–99)
Glucose-Capillary: 131 mg/dL — ABNORMAL HIGH (ref 70–99)
Glucose-Capillary: 168 mg/dL — ABNORMAL HIGH (ref 70–99)

## 2019-05-10 LAB — HEMOGLOBIN A1C
Hgb A1c MFr Bld: 7.5 % — ABNORMAL HIGH (ref 4.8–5.6)
Mean Plasma Glucose: 169 mg/dL

## 2019-05-10 MED ORDER — ONDANSETRON HCL 4 MG PO TABS: 4.0000 mg | ORAL_TABLET | Freq: Three times a day (TID) | ORAL | 0 refills | Status: AC | PRN

## 2019-05-10 MED ORDER — PANTOPRAZOLE SODIUM 40 MG PO TBEC: 40.0000 mg | DELAYED_RELEASE_TABLET | Freq: Every day | ORAL | 0 refills | Status: DC

## 2019-05-10 MED ORDER — POTASSIUM CHLORIDE CRYS ER 20 MEQ PO TBCR
40.0000 meq | EXTENDED_RELEASE_TABLET | Freq: Two times a day (BID) | ORAL | Status: DC
  Administered 2019-05-10: 40 meq via ORAL
  Filled 2019-05-10: qty 2

## 2019-05-10 MED ORDER — ONDANSETRON 4 MG PO TBDP: 4.0000 mg | ORAL_TABLET | Freq: Three times a day (TID) | ORAL | 0 refills | Status: DC | PRN

## 2019-05-10 NOTE — Plan of Care (Signed)

## 2019-05-10 NOTE — Discharge Summary (Signed)
[facility] - [place] at [facility]   PATIENT NAME: Micheal Hardin    MR#:  [id number]  DATE OF BIRTH:  1960-12-07  DATE OF ADMISSION:  05/08/2019   ADMITTING PHYSICIAN: Hannah Beat, MD  DATE OF DISCHARGE: 05/10/19  PRIMARY CARE PHYSICIAN: Patient, No Pcp Per   ADMISSION DIAGNOSIS:  Intractable nausea and vomiting [R11.2] DISCHARGE DIAGNOSIS:  Active Problems:   Intractable nausea and vomiting  SECONDARY DIAGNOSIS:   Past Medical History:  Diagnosis Date  . CHF (congestive heart failure) (HCC)   . Diabetes mellitus without complication (HCC)   . Hypertension    HOSPITAL COURSE:   [place] is a 58 year old male who presented to the ED with intractable nausea and vomiting.  He was admitted for further management.  Nausea/vomiting-likely secondary to gastroenteritis versus gastroparesis -Nausea and vomiting completely resolved on the day of discharge -Patient was discharged home with daily Protonix and Zofran.  Chronic systolic CHF- ECHO this admission with EF 20-25% and moderate to severe mitral valve regurgitation. -Patient was euvolemic throughout this hospitalization with no signs of acute exacerbation -Patient is from out of town and needs to f/u with his cardiologist -BP was soft, so unable to add beta blocker or ACE this admission -Lasix was continued on discharge  Type 2 diabetes- A1c 7.5% -Needs to f/u with PCP  DISCHARGE CONDITIONS:  Nausea/vomiting- likely viral gastroenteritis Chronic systolic CHF Type 2 diabetes CONSULTS OBTAINED:  None DRUG ALLERGIES:  No Known Allergies DISCHARGE MEDICATIONS:   Allergies as of 05/10/2019   No Known Allergies     Medication List    TAKE these medications   escitalopram 10 MG tablet Commonly known as: LEXAPRO Take 10 mg by mouth daily.   furosemide 40 MG tablet Commonly known as: LASIX Take 80 mg by mouth daily.   hydrOXYzine 25 MG capsule Commonly known as: VISTARIL Take 25 mg  by mouth 3 (three) times daily.   ondansetron 4 MG disintegrating tablet Commonly known as: Zofran ODT Take 1 tablet (4 mg total) by mouth every 8 (eight) hours as needed for nausea or vomiting.   oxyCODONE-acetaminophen 5-325 MG tablet Commonly known as: Percocet Take 1 tablet by mouth every 4 (four) hours as needed for severe pain.   pantoprazole 40 MG tablet Commonly known as: Protonix Take 1 tablet (40 mg total) by mouth daily for 30 days.        DISCHARGE INSTRUCTIONS:  1. F/u with PCP in 5 days 2. F/u with cardiologist in 1-2 weeks 3. Start protonix 40mg  daily DIET:  Cardiac diet and Diabetic diet DISCHARGE CONDITION:  Stable ACTIVITY:  Activity as tolerated OXYGEN:  Home Oxygen: No.  Oxygen Delivery: room air DISCHARGE LOCATION:  home   If you experience worsening of your admission symptoms, develop shortness of breath, life threatening emergency, suicidal or homicidal thoughts you must seek medical attention immediately by calling 911 or calling your MD immediately  if symptoms less severe.  You Must read complete instructions/literature along with all the possible adverse reactions/side effects for all the Medicines you take and that have been prescribed to you. Take any new Medicines after you have completely understood and accpet all the possible adverse reactions/side effects.   Please note  You were cared for by a hospitalist during your hospital stay. If you have any questions about your discharge medications or the care you received while you were in the hospital after you are discharged, you can call the unit and asked to speak  with the hospitalist on call if the hospitalist that took care of you is not available. Once you are discharged, your primary care physician will handle any further medical issues. Please note that NO REFILLS for any discharge medications will be authorized once you are discharged, as it is imperative that you return to your primary care  physician (or establish a relationship with a primary care physician if you do not have one) for your aftercare needs so that they can reassess your need for medications and monitor your lab values.    On the day of Discharge:  VITAL SIGNS:  Blood pressure 120/88, pulse 92, temperature 97.7 F (36.5 C), temperature source Oral, resp. rate 16, height 5\' 7"  (1.702 m), weight 75 kg, SpO2 97 %. PHYSICAL EXAMINATION:  GENERAL:  58 y.o.-year-old patient lying in the bed with no acute distress.  EYES: Pupils equal, round, reactive to light and accommodation. No scleral icterus. Extraocular muscles intact.  HEENT: Head atraumatic, normocephalic. Oropharynx and nasopharynx clear.  NECK:  Supple, no jugular venous distention. No thyroid enlargement, no tenderness.  LUNGS: Normal breath sounds bilaterally, no wheezing, rales,rhonchi or crepitation. No use of accessory muscles of respiration.  CARDIOVASCULAR: RRR, S1, S2 normal. No murmurs, rubs, or gallops.  ABDOMEN: Soft, non-distended. Bowel sounds present. No organomegaly or mass. +mild periumbilical tenderness to palpation. EXTREMITIES: No pedal edema, cyanosis, or clubbing.  NEUROLOGIC: Cranial nerves II through XII are intact. Muscle strength 5/5 in all extremities. Sensation intact. Gait not checked.  PSYCHIATRIC: The patient is alert and oriented x 3.  SKIN: No obvious rash, lesion, or ulcer.  DATA REVIEW:   CBC Recent Labs  Lab 05/10/19 0601  WBC 7.8  HGB 13.8  HCT 41.6  PLT 238    Chemistries  Recent Labs  Lab 05/08/19 1834  05/10/19 0601  NA 135   < > 138  K 4.0   < > 3.2*  CL 100   < > 104  CO2 23   < > 25  GLUCOSE 210*   < > 135*  BUN 10   < > 11  CREATININE 0.91   < > 0.90  CALCIUM 8.9   < > 8.6*  AST 24  --   --   ALT 14  --   --   ALKPHOS 99  --   --   BILITOT 1.8*  --   --    < > = values in this interval not displayed.     Microbiology Results  No results found for this or any previous visit.  RADIOLOGY:   No results found.   Management plans discussed with the patient, family and they are in agreement.  CODE STATUS: Full Code   TOTAL TIME TAKING CARE OF THIS PATIENT: 40 minutes.    Berna Spare Athanasius Kesling M.D on 05/10/2019 at 9:09 AM  Between 7am to 6pm - Pager - [phone]  After 6pm go to [email or web address] - Proofreader  [facility] [place] [facility]  Office  [phone]  CC: Primary care physician; Patient, No Pcp Per   Note: This dictation was prepared with Dragon dictation along with smaller phrase technology. Any transcriptional errors that result from this process are unintentional.

## 2019-05-10 NOTE — Discharge Instructions (Signed)
It was so nice to meet you during this hospitalization!  You came into the hospital with nausea and vomiting. I think you probably have food poisoning. You may develop some diarrhea over the next couple of days  I have prescribed the following medications: 1. Zofran 4mg - you can take 1 tablet every 6 hours as needed for nausea and vomiting 2. Protonix 40mg - take 1 tablet daily to help with acid reflux and irritation of your esophagus and throat  Take care, Dr. Brett Albino

## 2019-05-11 LAB — HIV ANTIBODY (ROUTINE TESTING W REFLEX): HIV Screen 4th Generation wRfx: NONREACTIVE

## 2019-05-11 LAB — NOVEL CORONAVIRUS, NAA (HOSP ORDER, SEND-OUT TO REF LAB; TAT 18-24 HRS): SARS-CoV-2, NAA: NOT DETECTED

## 2020-05-27 IMAGING — CT CT ABDOMEN AND PELVIS WITH CONTRAST
2 of 5 series · 16 of 46 positions shown, 18 images · IV contrast (omnipaque)
Comparison: None.

CLINICAL DATA: Patient ambulatory to triage with steady gait,
without difficulty or distress noted; pt reports since last night
having mid CP, generalized abd pain accomp by N/V. History of
splenectomy

EXAM:
CT ABDOMEN AND PELVIS WITH CONTRAST
TECHNIQUE: Multidetector CT imaging of the abdomen and pelvis was performed
using the standard protocol following bolus administration of
intravenous contrast.
CONTRAST:  100mL OMNIPAQUE IOHEXOL 300 MG/ML  SOLN

[Series 2: routine abd/pel with · axial · 0.87mm/px · z∈[-408,-28]mm · 13 of 88 slices shown, 15 images]
[im 6/88  soft-tissue]
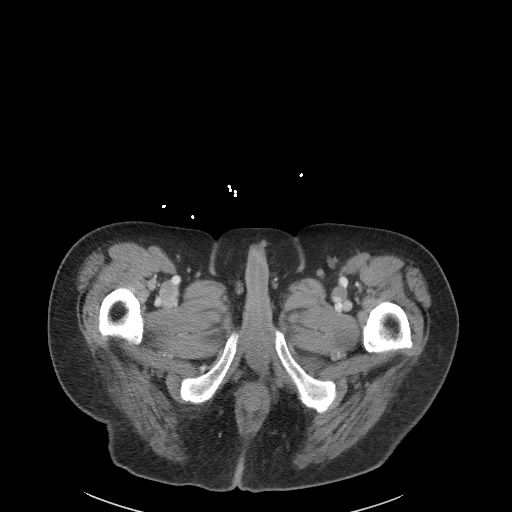
[im 6/88  bone]
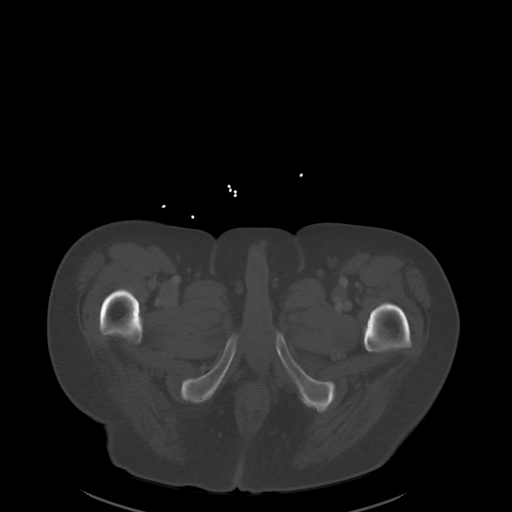
[im 11/88  soft-tissue]
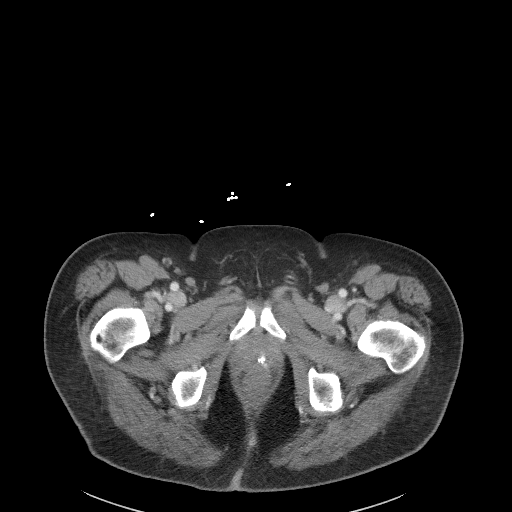
[im 21/88  soft-tissue]
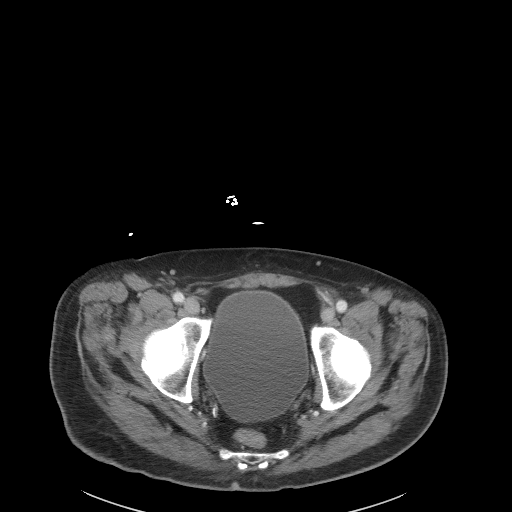
[im 26/88  soft-tissue]
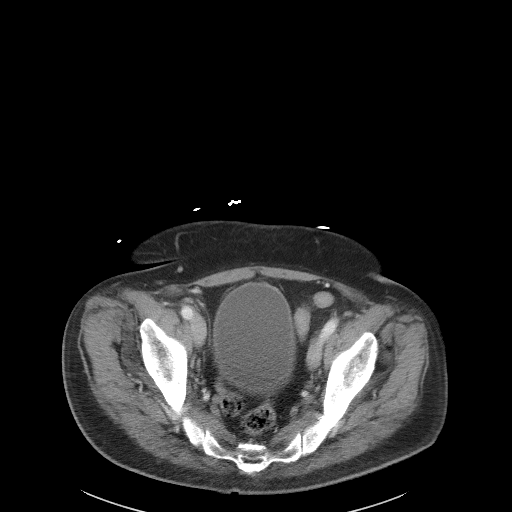
[im 31/88  soft-tissue]
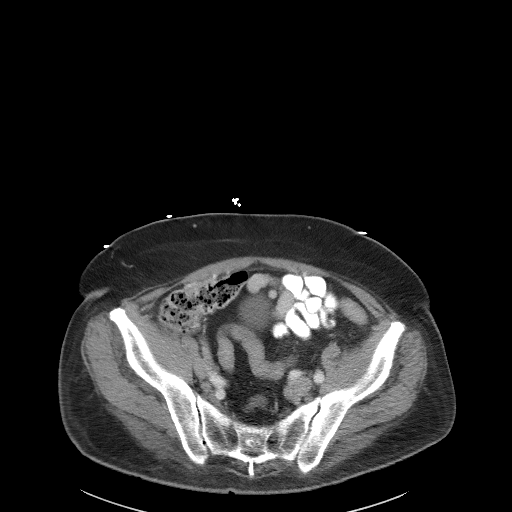
[im 36/88  soft-tissue]
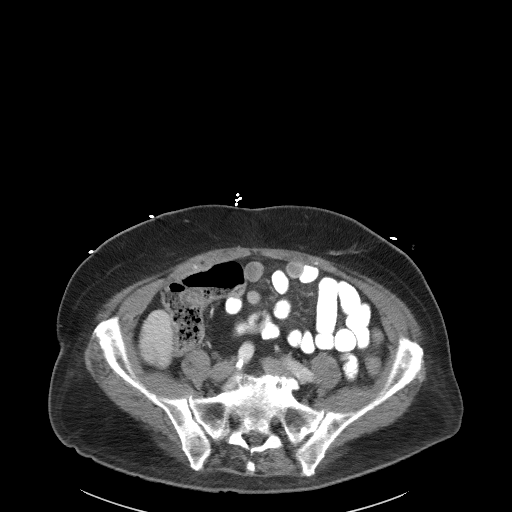
[im 47/88  soft-tissue]
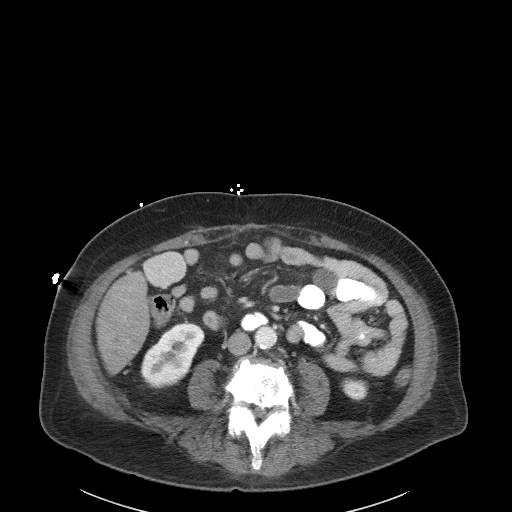
[im 52/88  soft-tissue]
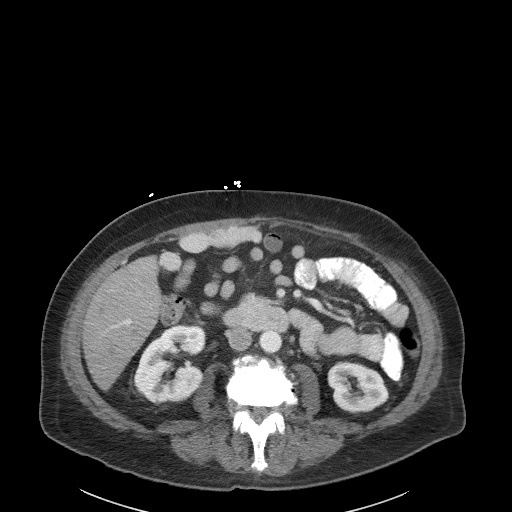
[im 57/88  soft-tissue]
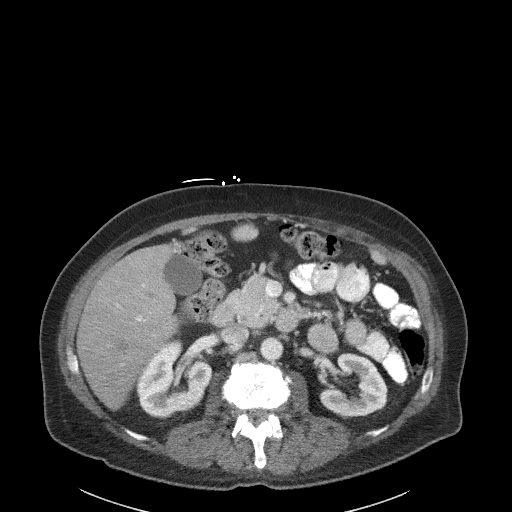
[im 57/88  bone]
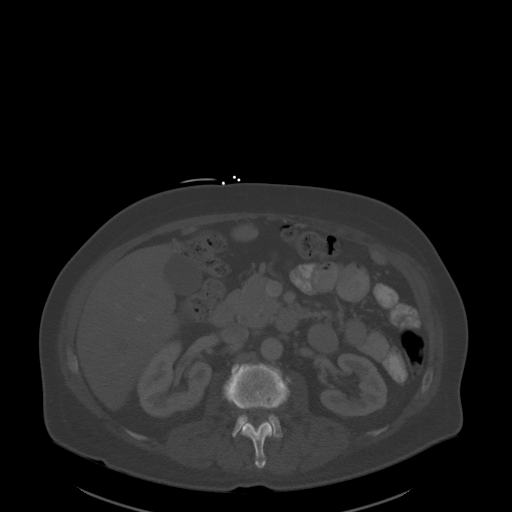
[im 62/88  soft-tissue]
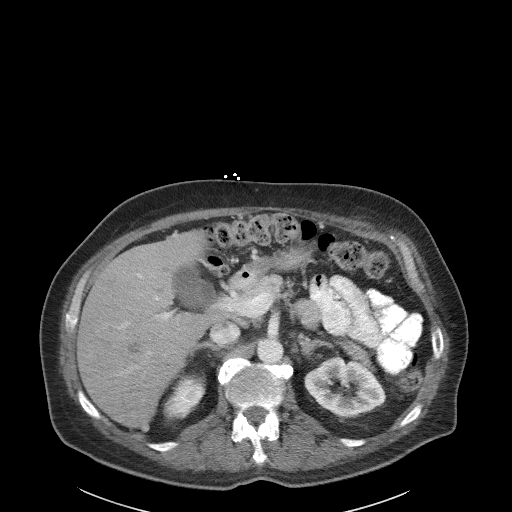
[im 67/88  soft-tissue]
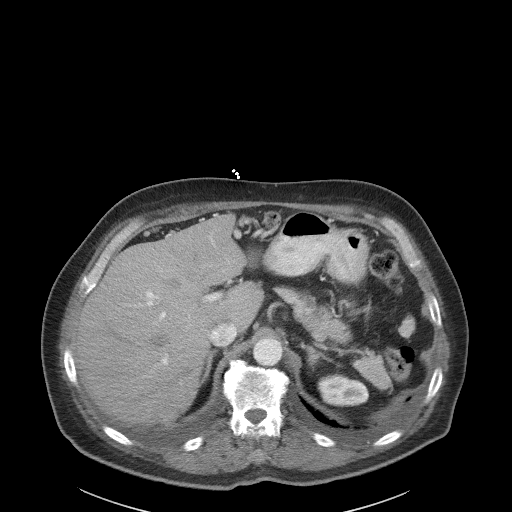
[im 77/88  soft-tissue]
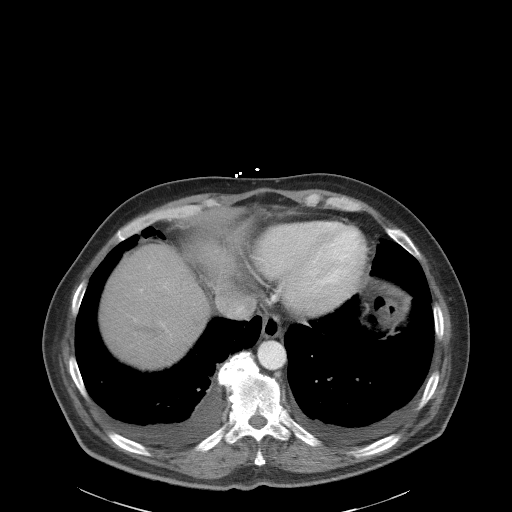
[im 82/88  soft-tissue]
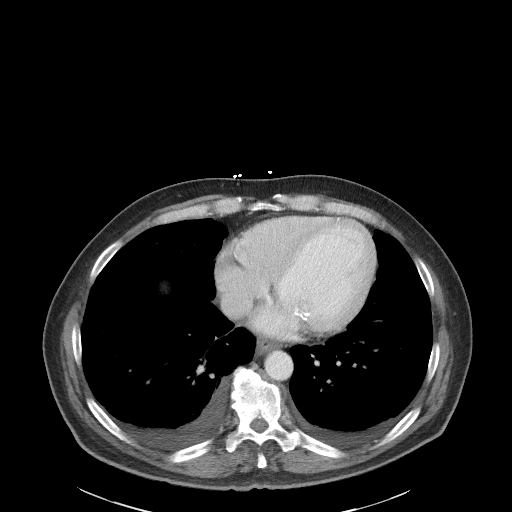

[Series 5: coronal st · coronal · 0.74mm/px · 3 of 91 slices shown]
[im 31/91  soft-tissue]
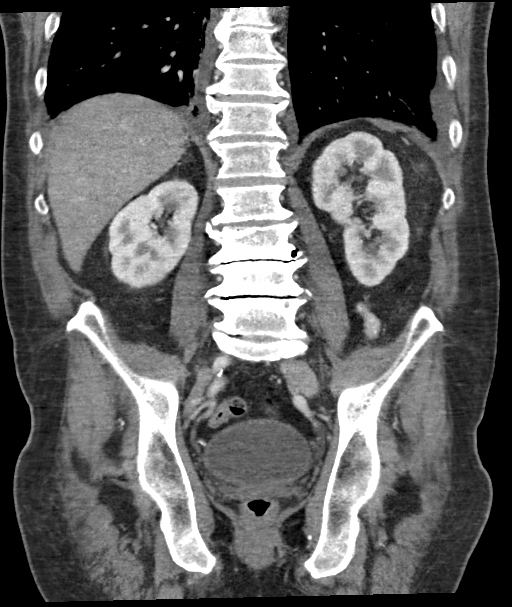
[im 41/91  soft-tissue]
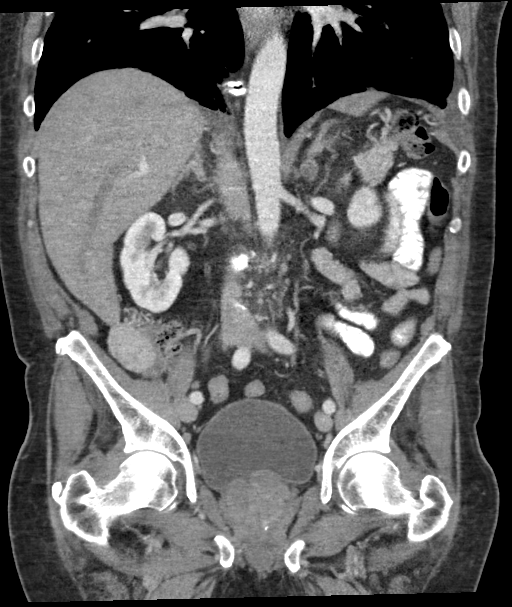
[im 51/91  soft-tissue]
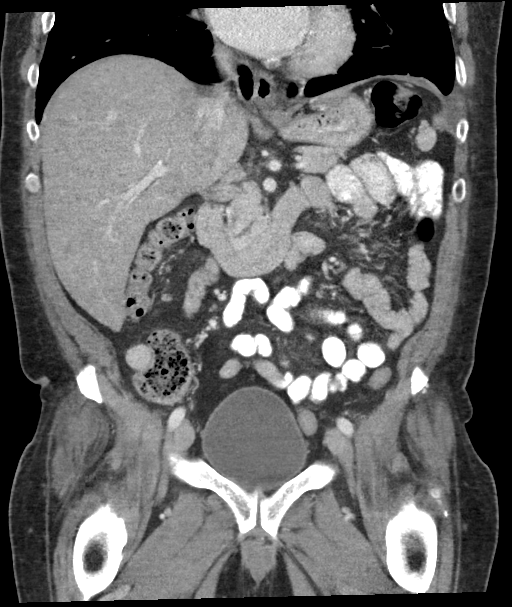

[16 of 46 positions shown; findings below may reference images not displayed]

FINDINGS: Lower chest: Small bilateral pleural effusions. Septal thickening in
the visualized lung bases.

Hepatobiliary: No focal liver abnormality is seen. No gallstones,
gallbladder wall thickening, or biliary dilatation.

Pancreas: Unremarkable. No pancreatic ductal dilatation or
surrounding inflammatory changes.

Spleen: Surgically absent. However, there are multiple homogeneously
enhancing somewhat lobular abdominal and right pelvic peritoneal
masses measuring from 1.3 to 7.3 cm in diameter suggesting
posttraumatic or postop heterotopic autotransplantation of splenic
tissue.

Adrenals/Urinary Tract: 1.5 cm low-attenuation left adrenal nodule,
possibly adenoma but nonspecific. Right adrenal unremarkable. No
renal mass or hydronephrosis. Urinary bladder physiologically
distended.

Stomach/Bowel: Stomach is nondilated. Small bowel decompressed. The
colon is nondilated, unremarkable.

Vascular/Lymphatic: Mild scattered aortoiliac calcified atheromatous
plaque without aneurysm or evident stenosis. No abdominal or pelvic
adenopathy.

Reproductive: Prostate enlargement with central coarse
calcifications.

Other: No ascites. No free air.

Musculoskeletal: Advanced spondylitic changes in the lower lumbar
spine. No fracture or worrisome bone lesion.
IMPRESSION: 1. Small bilateral pleural effusions and septal thickening in the
visualized lung bases suggesting CHF.
2. Multiple homogeneously enhancing peritoneal masses post
splenectomy, suggesting splenosis. Comparison with prior outside
studies if available would be useful to confirm. Alternatively,
[id number] sulfur colloid scintigraphy can also be diagnostic.
3. 1.5 cm left adrenal nodule, possibly adenoma but nonspecific.

## 2020-05-28 IMAGING — DX ABDOMEN - 2 VIEW
3 series · 3 of 3 positions shown · non-contrast
Comparison: CT 05/07/2019

CLINICAL DATA: Nausea vomiting and fatigue

EXAM:
ABDOMEN - 2 VIEW

[abdomen erect]
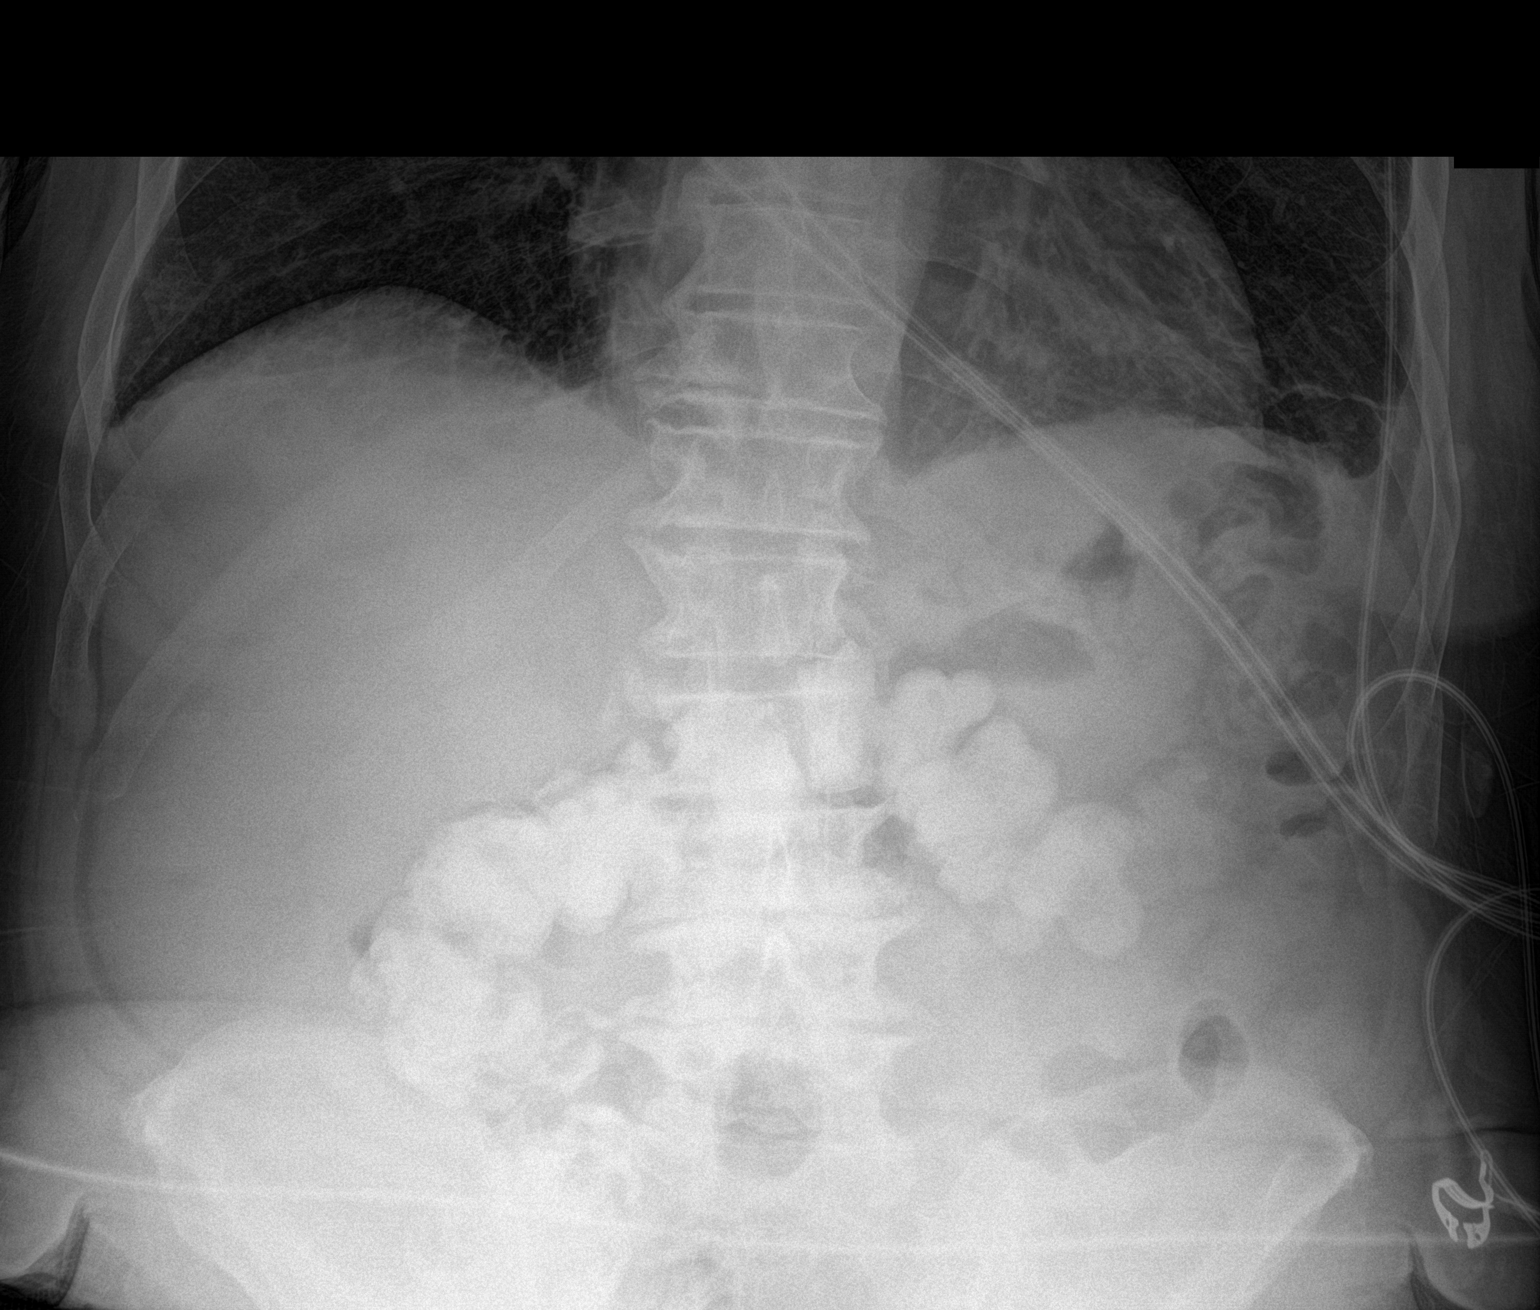

[abdomen supine (1 of 2)]
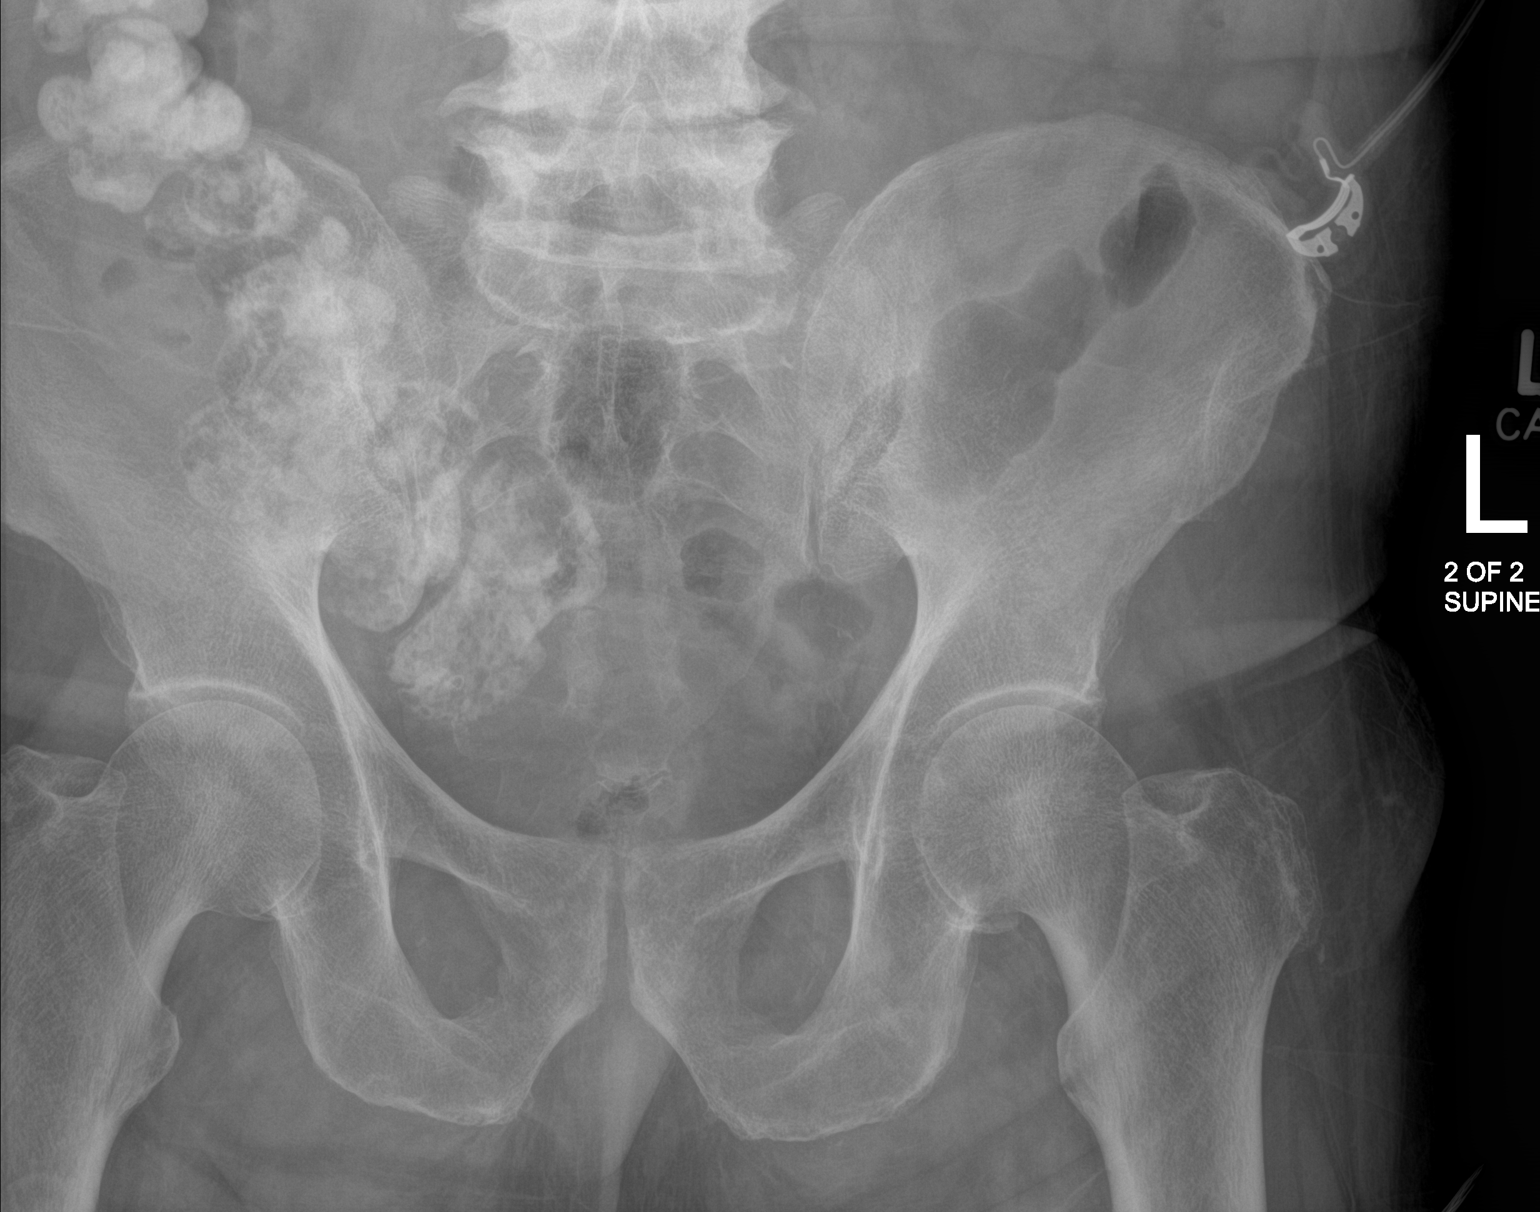

[abdomen supine (2 of 2)]
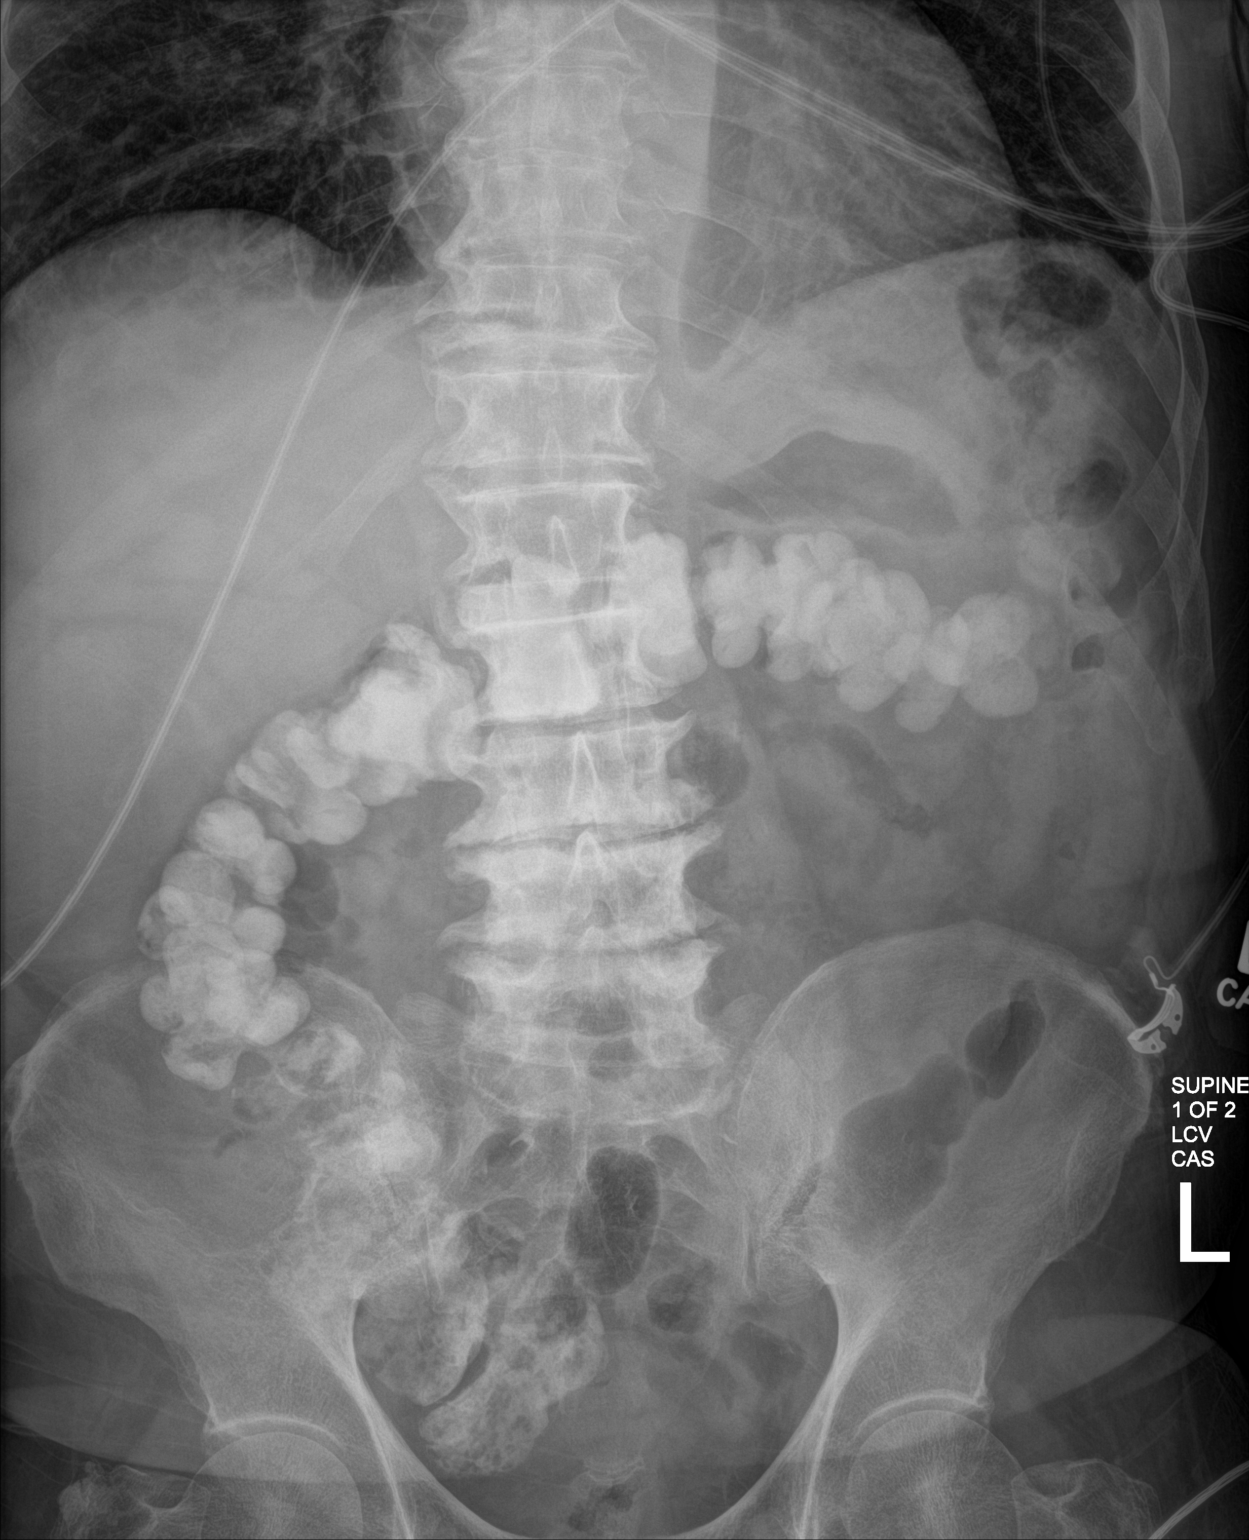

[3 of 3 positions shown; findings below may reference images not displayed]

FINDINGS: Linear atelectasis left base. Small left effusion. Enteral contrast
within the colon. Nonobstructed bowel-gas pattern.
IMPRESSION: 1. Nonobstructed bowel gas pattern with contrast in the colon
2. Small left effusion

## 2020-11-16 ENCOUNTER — Emergency Department: Payer: Medicare HMO

## 2020-11-16 ENCOUNTER — Emergency Department
Admission: EM | Admit: 2020-11-16 | Discharge: 2020-11-16 | Disposition: A | Discharge status: Home / Self Care | Payer: Medicare HMO | Attending: Emergency Medicine | Admitting: Emergency Medicine

## 2020-11-16 ENCOUNTER — Other Ambulatory Visit: Payer: Self-pay

## 2020-11-16 DIAGNOSIS — R0602 Shortness of breath: Secondary | ICD-10-CM | POA: Diagnosis not present

## 2020-11-16 DIAGNOSIS — I509 Heart failure, unspecified: Secondary | ICD-10-CM | POA: Insufficient documentation

## 2020-11-16 DIAGNOSIS — Z79899 Other long term (current) drug therapy: Secondary | ICD-10-CM | POA: Diagnosis not present

## 2020-11-16 DIAGNOSIS — E119 Type 2 diabetes mellitus without complications: Secondary | ICD-10-CM | POA: Insufficient documentation

## 2020-11-16 DIAGNOSIS — Z20822 Contact with and (suspected) exposure to covid-19: Secondary | ICD-10-CM | POA: Insufficient documentation

## 2020-11-16 DIAGNOSIS — R0789 Other chest pain: Secondary | ICD-10-CM | POA: Insufficient documentation

## 2020-11-16 DIAGNOSIS — R079 Chest pain, unspecified: Secondary | ICD-10-CM | POA: Diagnosis present

## 2020-11-16 DIAGNOSIS — Z794 Long term (current) use of insulin: Secondary | ICD-10-CM | POA: Diagnosis not present

## 2020-11-16 DIAGNOSIS — I110 Hypertensive heart disease with heart failure: Secondary | ICD-10-CM | POA: Insufficient documentation

## 2020-11-16 LAB — BASIC METABOLIC PANEL WITH GFR
Anion gap: 11 (ref 5–15)
BUN: 24 mg/dL — ABNORMAL HIGH (ref 8–23)
CO2: 23 mmol/L (ref 22–32)
Calcium: 8.8 mg/dL — ABNORMAL LOW (ref 8.9–10.3)
Chloride: 103 mmol/L (ref 98–111)
Creatinine, Ser: 1.01 mg/dL (ref 0.61–1.24)
GFR, Estimated: >60 mL/min (ref >60)
Glucose, Bld: 173 mg/dL — ABNORMAL HIGH (ref 70–99)
Potassium: 4 mmol/L (ref 3.5–5.1)
Sodium: 137 mmol/L (ref 135–145)

## 2020-11-16 LAB — CBC
HCT: 41 % (ref 39.0–52.0)
Hemoglobin: 13.7 g/dL (ref 13.0–17.0)
MCH: 29.9 pg (ref 26.0–34.0)
MCHC: 33.4 g/dL (ref 30.0–36.0)
MCV: 89.5 fL (ref 80.0–100.0)
Platelets: 236 10*3/uL (ref 150–400)
RBC: 4.58 MIL/uL (ref 4.22–5.81)
RDW: 14.9 % (ref 11.5–15.5)
WBC: 8.8 10*3/uL (ref 4.0–10.5)
nRBC: 0 % (ref 0.0–0.2)

## 2020-11-16 LAB — TROPONIN I (HIGH SENSITIVITY)
Troponin I (High Sensitivity): 7 ng/L (ref <18)
Troponin I (High Sensitivity): 8 ng/L (ref <18)

## 2020-11-16 LAB — BRAIN NATRIURETIC PEPTIDE: B Natriuretic Peptide: 26.5 pg/mL (ref 0.0–100.0)

## 2020-11-16 LAB — RESP PANEL BY RT-PCR (FLU A&B, COVID) ARPGX2
Influenza A by PCR: NEGATIVE
Influenza B by PCR: NEGATIVE
SARS Coronavirus 2 by RT PCR: NEGATIVE

## 2020-11-16 LAB — POC SARS CORONAVIRUS 2 AG -  ED: SARS Coronavirus 2 Ag: NEGATIVE

## 2020-11-16 MED ORDER — ACETAMINOPHEN 500 MG PO TABS
1000.0000 mg | ORAL_TABLET | Freq: Once | ORAL | Status: AC
  Administered 2020-11-16: 19:00:00 1000 mg via ORAL
  Filled 2020-11-16: qty 2

## 2020-11-16 MED ORDER — FUROSEMIDE 40 MG PO TABS
80.0000 mg | ORAL_TABLET | Freq: Once | ORAL | Status: AC
  Administered 2020-11-16: 19:00:00 80 mg via ORAL
  Filled 2020-11-16: qty 2

## 2020-11-16 NOTE — ED Provider Notes (Signed)
[facility] Emergency Department Provider Note ____________________________________________   Event Date/Time   First MD Initiated Contact with Patient 11/16/20 1821     (approximate)  I have reviewed the triage vital signs and the nursing notes.  HISTORY  Chief Complaint Chest Pain   HPI Micheal Hardin is a 59 y.o. malewho presents to the ED for evaluation of chest pain.   Chart review indicates HTN, DM on insulin CHF on spironolactone and Lasix. Patient reports temporarily and accidentally not having his diuretic medications for the span of about 1 week, re-acquiring them 3 days ago.  Has been compliant since then.    Patient presents to the ED with few days of upper respiratory congestion, clear rhinorrhea, chest pressure and shortness of breath.  He reports the sensations are reminiscent of previous heart failure exacerbations, and indicates that he often does not get lower extremity swelling.  Indicates the possibility of COVID-19 due to his respiratory congestion, rhinorrhea and a sick contact about 1 week ago.   Past Medical History:  Diagnosis Date  . CHF (congestive heart failure) (HCC)   . Diabetes mellitus without complication (HCC)   . Hypertension     Patient Active Problem List   Diagnosis Date Noted  . Intractable nausea and vomiting 05/08/2019    Past Surgical History:  Procedure Laterality Date  . SPLENECTOMY, TOTAL    . TONSILLECTOMY      Prior to Admission medications   Medication Sig Start Date End Date Taking? Authorizing Provider  escitalopram (LEXAPRO) 10 MG tablet Take 10 mg by mouth daily. 04/19/19   [provider]  furosemide (LASIX) 40 MG tablet Take 80 mg by mouth daily.    [provider]  hydrOXYzine (VISTARIL) 25 MG capsule Take 25 mg by mouth 3 (three) times daily. 05/05/19   [provider]  ondansetron (ZOFRAN ODT) 4 MG disintegrating tablet Take 1 tablet (4 mg total) by mouth every  8 (eight) hours as needed for nausea or vomiting. 05/10/19   Mayo, Allyn Kenner, MD  oxyCODONE-acetaminophen (PERCOCET) 5-325 MG tablet Take 1 tablet by mouth every 4 (four) hours as needed for severe pain. 05/07/19   Minna Antis, MD  pantoprazole (PROTONIX) 40 MG tablet Take 1 tablet (40 mg total) by mouth daily for 30 days. 05/10/19 06/09/19  Mayo, Allyn Kenner, MD  pantoprazole (PROTONIX) 40 MG tablet Take 1 tablet (40 mg total) by mouth daily for 30 days. 05/10/19 06/09/19  Mayo, Allyn Kenner, MD    Allergies Patient has no known allergies.  No family history on file.  Social History Social History   Tobacco Use  . Smoking status: Never Smoker  . Smokeless tobacco: Never Used    Review of Systems  Constitutional: No fever/chills Eyes: No visual changes. ENT: No sore throat.  Positive for respiratory congestion and clear rhinorrhea. Cardiovascular: Positive for chest pain. Respiratory: Positive for shortness of breath. Gastrointestinal: No abdominal pain.  No nausea, no vomiting.  No diarrhea.  No constipation. Genitourinary: Negative for dysuria. Musculoskeletal: Negative for back pain. Skin: Negative for rash. Neurological: Negative for headaches, focal weakness or numbness.  ____________________________________________   PHYSICAL EXAM:  VITAL SIGNS: Vitals:   11/16/20 1755 11/16/20 1915  BP: 114/78 100/69  Pulse: 78 85  Resp: 20 18  Temp: 97.7 F (36.5 C)   SpO2: 98% 96%     Constitutional: Alert and oriented. Well appearing and in no acute distress. Eyes: Conjunctivae are normal. PERRL. EOMI. Head: Atraumatic. Nose:  No congestion/rhinnorhea. Mouth/Throat: Mucous membranes are moist.  Oropharynx non-erythematous. Neck: No stridor. No cervical spine tenderness to palpation. Cardiovascular: Normal rate, regular rhythm. Grossly normal heart sounds.  Good peripheral circulation. Respiratory: Normal respiratory effort.  No retractions. Lungs CTAB. Gastrointestinal:  Soft , nondistended, nontender to palpation. No CVA tenderness. Musculoskeletal: No lower extremity tenderness nor edema.  No joint effusions. No signs of acute trauma. Neurologic:  Normal speech and language. No gross focal neurologic deficits are appreciated. No gait instability noted. Skin:  Skin is warm, dry and intact. No rash noted. Psychiatric: Mood and affect are normal. Speech and behavior are normal.  ____________________________________________   LABS (all labs ordered are listed, but only abnormal results are displayed)  Labs Reviewed  BASIC METABOLIC PANEL - Abnormal; Notable for the following components:      Result Value   Glucose, Bld 173 (*)    BUN 24 (*)    Calcium 8.8 (*)    All other components within normal limits  RESP PANEL BY RT-PCR (FLU A&B, COVID) [id number]  CBC  BRAIN NATRIURETIC PEPTIDE  POC SARS CORONAVIRUS 2 AG -  ED  TROPONIN I (HIGH SENSITIVITY)  TROPONIN I (HIGH SENSITIVITY)   ____________________________________________  12 Lead EKG  Sinus rhythm, rate of 86 bpm.  Normal axis.  Left anterior fascicular block, QTC 490 ms.  No evidence of acute ischemia ____________________________________________  RADIOLOGY  ED MD interpretation: 2 view CXR reviewed by me without evidence of acute cardiopulmonary pathology.  Official radiology report(s): DG Chest 2 View  Result Date: 11/16/2020 CLINICAL DATA:  Shortness of breath EXAM: CHEST - 2 VIEW COMPARISON:  None. FINDINGS: The heart size and mediastinal contours are within normal limits. Aortic atherosclerosis. Linear scarring or atelectasis at the left lung base. The visualized skeletal structures are unremarkable. IMPRESSION: No active cardiopulmonary disease. Linear scarring or atelectasis at the left lung base. Electronically Signed   By: Jasmine Pang M.D.   On: 11/16/2020 15:14   ____________________________________________   PROCEDURES and INTERVENTIONS  Procedure(s) performed (including  Critical Care):  .1-3 Lead EKG Interpretation Performed by: Delton Prairie, MD Authorized by: Delton Prairie, MD     Interpretation: normal     ECG rate:  80   ECG rate assessment: normal     Rhythm: sinus rhythm     Ectopy: none     Conduction: normal      Medications  acetaminophen (TYLENOL) tablet 1,000 mg (1,000 mg Oral Given 11/16/20 1912)  furosemide (LASIX) tablet 80 mg (80 mg Oral Given 11/16/20 1914)    ____________________________________________   MDM / ED COURSE   59 year old male with history of CHF presents to the ED with chest tightness, without evidence of acute derangements, and amenable to outpatient management.  Normal vitals on room air.  Exam without evidence of acute pathology.  Blood work without evidence of acute derangements and is at his baseline.  CXR without infiltrates or pulmonary vascular congestion, the patient reports about 1 week when he was without his diuretics accidentally and reports his symptoms are reminiscent of CHF exacerbation, the patient was empirically provided furosemide 80 mg orally, which is well-tolerated.  Patient improving symptoms throughout his observation time in the ED after Tylenol and Lasix.  Covid antigen test is negative, and awaiting Covid PCR test.  No evidence of pathology to preclude outpatient management.  Patient medically stable for discharge home.   Clinical Course as of 11/16/20 2058  Thu Nov 16, 2020  1955 Reassessed.  Patient sleeping  comfortably.  No distress. [DS]  2031 Reassessed.  Patient reports feeling slightly better.  We discussed need for Covid PCR test due to negative antigen test.  He is agreeable.  Denies any chest pain at this time.  Reports his breathing is "easing up "" [DS]    Clinical Course User Index [DS] Delton Prairie, MD    ____________________________________________   FINAL CLINICAL IMPRESSION(S) / ED DIAGNOSES  Final diagnoses:  Other chest pain     ED Discharge Orders    None        Latasha Buczkowski Katrinka Blazing   Note:  This document was prepared using Dragon voice recognition software and may include unintentional dictation errors.   Delton Prairie, MD 11/16/20 2059

## 2020-11-16 NOTE — Discharge Instructions (Signed)
As we discussed, please check your MyChart results for your COVID-19 test result.  There is no signs of strain or damage to your heart, and no signs of severe heart failure.  Use Tylenol for pain and fevers.  Up to 1000 mg per dose, up to 4 times per day.  Do not take more than 4000 mg of Tylenol/acetaminophen within 24 hours..  Continue to take all of your regular prescription medications.  Return to the ED with any worsening symptoms

## 2020-11-16 NOTE — ED Triage Notes (Signed)
Pt arrived to ED with c/o chest pain and SOB for over 1 week. Pt states hx of CHF. Pt states he thought he was doing better but now he doesn't feel good anymore.   Pt in NAD at this time.

## 2021-04-15 ENCOUNTER — Emergency Department (HOSPITAL_COMMUNITY): Payer: Medicare Other

## 2021-04-15 ENCOUNTER — Encounter (HOSPITAL_COMMUNITY): Payer: Self-pay | Admitting: Emergency Medicine

## 2021-04-15 ENCOUNTER — Other Ambulatory Visit: Payer: Self-pay

## 2021-04-15 ENCOUNTER — Inpatient Hospital Stay (HOSPITAL_COMMUNITY)
Admission: EM | Admit: 2021-04-15 | Discharge: 2021-04-18 | DRG: 291 | Disposition: A | Discharge status: Home / Self Care | Payer: Medicare Other | Attending: Internal Medicine | Admitting: Internal Medicine

## 2021-04-15 DIAGNOSIS — Z20822 Contact with and (suspected) exposure to covid-19: Secondary | ICD-10-CM | POA: Diagnosis present

## 2021-04-15 DIAGNOSIS — R079 Chest pain, unspecified: Secondary | ICD-10-CM

## 2021-04-15 DIAGNOSIS — R072 Precordial pain: Secondary | ICD-10-CM | POA: Diagnosis present

## 2021-04-15 DIAGNOSIS — Z79899 Other long term (current) drug therapy: Secondary | ICD-10-CM

## 2021-04-15 DIAGNOSIS — I509 Heart failure, unspecified: Secondary | ICD-10-CM

## 2021-04-15 DIAGNOSIS — N179 Acute kidney failure, unspecified: Secondary | ICD-10-CM | POA: Diagnosis present

## 2021-04-15 DIAGNOSIS — R0602 Shortness of breath: Secondary | ICD-10-CM | POA: Diagnosis not present

## 2021-04-15 DIAGNOSIS — I110 Hypertensive heart disease with heart failure: Principal | ICD-10-CM | POA: Diagnosis present

## 2021-04-15 DIAGNOSIS — IMO0002 Reserved for concepts with insufficient information to code with codable children: Secondary | ICD-10-CM

## 2021-04-15 DIAGNOSIS — I420 Dilated cardiomyopathy: Secondary | ICD-10-CM | POA: Diagnosis present

## 2021-04-15 DIAGNOSIS — M79604 Pain in right leg: Secondary | ICD-10-CM

## 2021-04-15 DIAGNOSIS — E876 Hypokalemia: Secondary | ICD-10-CM | POA: Diagnosis not present

## 2021-04-15 DIAGNOSIS — Z9081 Acquired absence of spleen: Secondary | ICD-10-CM

## 2021-04-15 DIAGNOSIS — Z91199 Patient's noncompliance with other medical treatment and regimen due to unspecified reason: Secondary | ICD-10-CM

## 2021-04-15 DIAGNOSIS — F1290 Cannabis use, unspecified, uncomplicated: Secondary | ICD-10-CM | POA: Diagnosis present

## 2021-04-15 DIAGNOSIS — I5023 Acute on chronic systolic (congestive) heart failure: Secondary | ICD-10-CM | POA: Diagnosis present

## 2021-04-15 DIAGNOSIS — E871 Hypo-osmolality and hyponatremia: Secondary | ICD-10-CM | POA: Diagnosis present

## 2021-04-15 DIAGNOSIS — E1165 Type 2 diabetes mellitus with hyperglycemia: Secondary | ICD-10-CM | POA: Diagnosis present

## 2021-04-15 DIAGNOSIS — I340 Nonrheumatic mitral (valve) insufficiency: Secondary | ICD-10-CM | POA: Diagnosis present

## 2021-04-15 DIAGNOSIS — M6281 Muscle weakness (generalized): Secondary | ICD-10-CM | POA: Diagnosis present

## 2021-04-15 DIAGNOSIS — Z9119 Patient's noncompliance with other medical treatment and regimen: Secondary | ICD-10-CM

## 2021-04-15 DIAGNOSIS — E785 Hyperlipidemia, unspecified: Secondary | ICD-10-CM | POA: Diagnosis present

## 2021-04-15 HISTORY — DX: Unspecified osteoarthritis, unspecified site: M19.90

## 2021-04-15 LAB — TROPONIN I (HIGH SENSITIVITY)
Troponin I (High Sensitivity): 29 ng/L — ABNORMAL HIGH (ref <18)
Troponin I (High Sensitivity): 25 ng/L — ABNORMAL HIGH (ref <18)

## 2021-04-15 LAB — HEPATIC FUNCTION PANEL
ALT: 22 U/L (ref 0–44)
AST: 26 U/L (ref 15–41)
Albumin: 3.4 g/dL — ABNORMAL LOW (ref 3.5–5.0)
Alkaline Phosphatase: 108 U/L (ref 38–126)
Bilirubin, Direct: 0.2 mg/dL (ref 0.0–0.2)
Indirect Bilirubin: 0.7 mg/dL (ref 0.3–0.9)
Total Bilirubin: 0.9 mg/dL (ref 0.3–1.2)
Total Protein: 7.2 g/dL (ref 6.5–8.1)

## 2021-04-15 LAB — CBC WITH DIFFERENTIAL/PLATELET
Abs Immature Granulocytes: 0.02 10*3/uL (ref 0.00–0.07)
Basophils Absolute: 0.1 10*3/uL (ref 0.0–0.1)
Basophils Relative: 1 %
Eosinophils Absolute: 0.2 10*3/uL (ref 0.0–0.5)
Eosinophils Relative: 2 %
HCT: 40.4 % (ref 39.0–52.0)
Hemoglobin: 13.4 g/dL (ref 13.0–17.0)
Immature Granulocytes: 0 %
Lymphocytes Relative: 20 %
Lymphs Abs: 1.9 10*3/uL (ref 0.7–4.0)
MCH: 29.7 pg (ref 26.0–34.0)
MCHC: 33.2 g/dL (ref 30.0–36.0)
MCV: 89.6 fL (ref 80.0–100.0)
Monocytes Absolute: 0.9 10*3/uL (ref 0.1–1.0)
Monocytes Relative: 9 %
Neutro Abs: 6.4 10*3/uL (ref 1.7–7.7)
Neutrophils Relative %: 68 %
Platelets: 260 10*3/uL (ref 150–400)
RBC: 4.51 MIL/uL (ref 4.22–5.81)
RDW: 14.1 % (ref 11.5–15.5)
WBC: 9.4 10*3/uL (ref 4.0–10.5)
nRBC: 0 % (ref 0.0–0.2)

## 2021-04-15 LAB — MAGNESIUM: Magnesium: 2.1 mg/dL (ref 1.7–2.4)

## 2021-04-15 LAB — BASIC METABOLIC PANEL
Anion gap: 10 (ref 5–15)
BUN: 9 mg/dL (ref 8–23)
CO2: 24 mmol/L (ref 22–32)
Calcium: 8.5 mg/dL — ABNORMAL LOW (ref 8.9–10.3)
Chloride: 103 mmol/L (ref 98–111)
Creatinine, Ser: 0.95 mg/dL (ref 0.61–1.24)
GFR, Estimated: >60 mL/min (ref >60)
Glucose, Bld: 204 mg/dL — ABNORMAL HIGH (ref 70–99)
Potassium: 3.1 mmol/L — ABNORMAL LOW (ref 3.5–5.1)
Sodium: 137 mmol/L (ref 135–145)

## 2021-04-15 LAB — RESP PANEL BY RT-PCR (FLU A&B, COVID) ARPGX2
Influenza A by PCR: NEGATIVE
Influenza B by PCR: NEGATIVE
SARS Coronavirus 2 by RT PCR: NEGATIVE

## 2021-04-15 LAB — BRAIN NATRIURETIC PEPTIDE: B Natriuretic Peptide: 312.2 pg/mL — ABNORMAL HIGH (ref 0.0–100.0)

## 2021-04-15 LAB — DIGOXIN LEVEL: Digoxin Level: <0.2 ng/mL — ABNORMAL LOW (ref 0.8–2.0)

## 2021-04-15 LAB — CBC
HCT: 40.9 % (ref 39.0–52.0)
Hemoglobin: 13.8 g/dL (ref 13.0–17.0)
MCH: 30.3 pg (ref 26.0–34.0)
MCHC: 33.7 g/dL (ref 30.0–36.0)
MCV: 89.7 fL (ref 80.0–100.0)
Platelets: 255 10*3/uL (ref 150–400)
RBC: 4.56 MIL/uL (ref 4.22–5.81)
RDW: 14.3 % (ref 11.5–15.5)
WBC: 8.7 10*3/uL (ref 4.0–10.5)
nRBC: 0 % (ref 0.0–0.2)

## 2021-04-15 LAB — D-DIMER, QUANTITATIVE: D-Dimer, Quant: 0.5 ug/mL-FEU (ref 0.00–0.50)

## 2021-04-15 LAB — GLUCOSE, CAPILLARY: Glucose-Capillary: 158 mg/dL — ABNORMAL HIGH (ref 70–99)

## 2021-04-15 MED ORDER — ACETAMINOPHEN 325 MG PO TABS
650.0000 mg | ORAL_TABLET | ORAL | Status: DC | PRN
  Administered 2021-04-16: 650 mg via ORAL
  Filled 2021-04-15: qty 2

## 2021-04-15 MED ORDER — INSULIN ASPART 100 UNIT/ML IJ SOLN
0.0000 [IU] | Freq: Every day | INTRAMUSCULAR | Status: DC
  Administered 2021-04-17: 2 [IU] via SUBCUTANEOUS

## 2021-04-15 MED ORDER — POTASSIUM CHLORIDE CRYS ER 20 MEQ PO TBCR
40.0000 meq | EXTENDED_RELEASE_TABLET | Freq: Once | ORAL | Status: AC
  Administered 2021-04-15: 40 meq via ORAL
  Filled 2021-04-15: qty 2

## 2021-04-15 MED ORDER — SODIUM CHLORIDE 0.9 % IV SOLN: 250.0000 mL | INTRAVENOUS | Status: DC | PRN

## 2021-04-15 MED ORDER — INSULIN ASPART 100 UNIT/ML IJ SOLN
0.0000 [IU] | Freq: Three times a day (TID) | INTRAMUSCULAR | Status: DC
Start: 2021-04-16 — End: 2021-04-18
  Administered 2021-04-16: 1 [IU] via SUBCUTANEOUS
  Administered 2021-04-16: 2 [IU] via SUBCUTANEOUS
  Administered 2021-04-16: 3 [IU] via SUBCUTANEOUS
  Administered 2021-04-17 (×2): 2 [IU] via SUBCUTANEOUS
  Administered 2021-04-17: 3 [IU] via SUBCUTANEOUS
  Administered 2021-04-18: 2 [IU] via SUBCUTANEOUS

## 2021-04-15 MED ORDER — SACUBITRIL-VALSARTAN 24-26 MG PO TABS
1.0000 | ORAL_TABLET | Freq: Two times a day (BID) | ORAL | Status: DC
  Administered 2021-04-15 – 2021-04-18 (×6): 1 via ORAL
  Filled 2021-04-15 (×7): qty 1

## 2021-04-15 MED ORDER — SODIUM CHLORIDE 0.9% FLUSH
3.0000 mL | Freq: Two times a day (BID) | INTRAVENOUS | Status: DC
  Administered 2021-04-15 – 2021-04-17 (×3): 3 mL via INTRAVENOUS

## 2021-04-15 MED ORDER — POTASSIUM CHLORIDE CRYS ER 20 MEQ PO TBCR
40.0000 meq | EXTENDED_RELEASE_TABLET | Freq: Every day | ORAL | Status: DC
  Administered 2021-04-15 – 2021-04-16 (×2): 40 meq via ORAL
  Filled 2021-04-15 (×2): qty 2

## 2021-04-15 MED ORDER — CARVEDILOL 6.25 MG PO TABS
6.2500 mg | ORAL_TABLET | Freq: Two times a day (BID) | ORAL | Status: DC
  Administered 2021-04-16 – 2021-04-18 (×5): 6.25 mg via ORAL
  Filled 2021-04-15 (×5): qty 1

## 2021-04-15 MED ORDER — SODIUM CHLORIDE 0.9% FLUSH: 3.0000 mL | INTRAVENOUS | Status: DC | PRN

## 2021-04-15 MED ORDER — ENOXAPARIN SODIUM 40 MG/0.4ML IJ SOSY
40.0000 mg | PREFILLED_SYRINGE | INTRAMUSCULAR | Status: DC
  Administered 2021-04-15 – 2021-04-17 (×3): 40 mg via SUBCUTANEOUS
  Filled 2021-04-15 (×3): qty 0.4

## 2021-04-15 MED ORDER — FUROSEMIDE 20 MG PO TABS: 80.0000 mg | ORAL_TABLET | Freq: Every day | ORAL | Status: DC

## 2021-04-15 MED ORDER — FUROSEMIDE 80 MG PO TABS
80.0000 mg | ORAL_TABLET | Freq: Every day | ORAL | Status: DC
  Administered 2021-04-16: 80 mg via ORAL
  Filled 2021-04-15: qty 1

## 2021-04-15 MED ORDER — ASPIRIN EC 81 MG PO TBEC
81.0000 mg | DELAYED_RELEASE_TABLET | Freq: Every day | ORAL | Status: DC
  Administered 2021-04-15 – 2021-04-18 (×4): 81 mg via ORAL
  Filled 2021-04-15 (×4): qty 1

## 2021-04-15 MED ORDER — EMPAGLIFLOZIN 10 MG PO TABS
10.0000 mg | ORAL_TABLET | Freq: Every day | ORAL | Status: DC
  Administered 2021-04-15 – 2021-04-18 (×4): 10 mg via ORAL
  Filled 2021-04-15 (×5): qty 1

## 2021-04-15 NOTE — H&P (Addendum)
History and Physical    Micheal Hardin IHK:[id number] DOB: Dec 27, 1960 DOA: 04/15/2021  PCP: Patient, No Pcp Per (Inactive)   Patient coming from: Home  Chief Complaint: CP, SOB  HPI: Micheal Hardin is a 60 y.o. male with medical history significant for HTN, Systolic CHF with EF of 20-25% on echo from June 2020, DMT2 (last HgbA1c was 7.5 over 6 months ago)-not on therapy at this time who presents with complaint of worsening shortness of breath and substernal chest pain.  He reports that he has been progressively getting more short of breath over the last week.  He states he gets short of breath walking across the room to go to the bathroom but he is also been having shortness of breath while sitting in his chair and not doing any activity.  States he has been sleeping more upright the last 2 nights secondary to shortness of breath.  Reports he has been having some substernal pressure and tightness in the substernal region that will occasionally radiate into the left shoulder and left upper arm and is associated with mild nausea and clammy feeling.  He has not had any cough or fever.  He reports that he had a cardiac catheterization in the [facility] area in November 2021 but no stents were placed.  States he has not had any follow-up in the last 6 to 7 months.  He recently moved to the [place] area with his daughter.  He reports he has been taking Lasix and carvedilol but he has not been taking any of the other medications he was prescribed before he has not established with a new physician.  He states he does have diabetes and he used to take Januvia but he has not been on therapy in over 6 or 7 months.  Reports his last hemoglobin A1c was 7.5 over 6 months ago.  Reports the symptoms do improve after he takes Lasix and urinate some.  States he has chronic mild swelling of his legs that has not been significantly different than his baseline. States he has never had DVTs or PEs.  All records report he  has used digoxin in the past but he is not sure when the last time he actually took it was. Lives with his daughter.  Denies alcohol use.  States he does not's smoke or use tobacco products but does smoke marijuana most days of the week.  Reports he last used marijuana last night  ED Course:   Mr. Harm dynamically stable in the emergency room.  Was 29 with repeat troponin of 25.  BNP elevated at 312.2.  Leukos 204, sodium 137, potassium 3.1, chloride 103, bicarb 24, BUN 9, creatinine 0.95, magnesium 2.1, normal LFTs.  CBC normal.  COVID negative, influenza A and B swab negative.  Ultrasound of lower extremities reveals no DVT bilaterally.  D-dimer  has been ordered and pending.  Chest x-ray shows no infiltrate or consolidation.  Review of Systems:  General: Denies fever, chills, weight loss, night sweats. Denies dizziness. Denies change in appetite HENT: Denies head trauma, headache, denies change in hearing, tinnitus. Denies nasal bleeding. Denies sore throat, sores in mouth.  Denies difficulty swallowing Eyes: Denies blurry vision, pain in eye, drainage.  Denies discoloration of eyes. Neck: Denies pain.  Denies swelling.  Denies pain with movement. Cardiovascular: Reports substeranl chest pain, palpitations  Reports mild chronic edema. Denies orthopnea Respiratory: Has shortness of breath.  Denies wheezing.  Denies sputum production Gastrointestinal: Denies abdominal pain, swelling. Reports nausea but no  vomiting, diarrhea.  Denies melena.  Denies hematemesis. Musculoskeletal: Denies limitation of movement.  Denies deformity or swelling.  Denies pain.  Denies arthralgias or myalgias. Genitourinary: Denies pelvic pain.  Denies urinary frequency or hesitancy.  Denies dysuria.  Skin: Denies rash.  Denies petechiae, purpura, ecchymosis. Neurological: Denies headache.  Denies syncope.  Denies seizure activity.  Denies weakness or paresthesia.  Denies slurred speech, drooping face.  Denies visual  change. Psychiatric: Denies depression, anxiety.  Denies hallucinations.  Past Medical History:  Diagnosis Date  . Arthritis   . CHF (congestive heart failure) (HCC)   . Diabetes mellitus without complication (HCC)   . Hypertension     Past Surgical History:  Procedure Laterality Date  . SPLENECTOMY, TOTAL    . TONSILLECTOMY      Social History  reports that he has never smoked. He has never used smokeless tobacco. He reports current drug use. Drug: Marijuana. He reports that he does not drink alcohol.  No Known Allergies  History reviewed. No pertinent family history.   Prior to Admission medications   Medication Sig Start Date End Date Taking? Authorizing Provider  escitalopram (LEXAPRO) 10 MG tablet Take 10 mg by mouth daily. 04/19/19   [provider]  furosemide (LASIX) 40 MG tablet Take 80 mg by mouth daily.    [provider]  hydrOXYzine (VISTARIL) 25 MG capsule Take 25 mg by mouth 3 (three) times daily. 05/05/19   [provider]  ondansetron (ZOFRAN ODT) 4 MG disintegrating tablet Take 1 tablet (4 mg total) by mouth every 8 (eight) hours as needed for nausea or vomiting. 05/10/19   Mayo, Allyn Kenner, MD  oxyCODONE-acetaminophen (PERCOCET) 5-325 MG tablet Take 1 tablet by mouth every 4 (four) hours as needed for severe pain. 05/07/19   Minna Antis, MD  pantoprazole (PROTONIX) 40 MG tablet Take 1 tablet (40 mg total) by mouth daily for 30 days. 05/10/19 06/09/19  Mayo, Allyn Kenner, MD  pantoprazole (PROTONIX) 40 MG tablet Take 1 tablet (40 mg total) by mouth daily for 30 days. 05/10/19 06/09/19  MayoAllyn Kenner, MD    Physical Exam: Vitals:   04/15/21 1830 04/15/21 1900 04/15/21 1915 04/15/21 2000  BP: (!) 131/91 (!) 135/98  (!) 132/92  Pulse: 89 93 92 89  Resp: 14 (!) 33 18 19  Temp: 98.1 F (36.7 C)     TempSrc: Oral     SpO2: 98% 99% 96% 98%    Constitutional: NAD, calm, comfortable Vitals:   04/15/21 1830 04/15/21 1900 04/15/21 1915  04/15/21 2000  BP: (!) 131/91 (!) 135/98  (!) 132/92  Pulse: 89 93 92 89  Resp: 14 (!) 33 18 19  Temp: 98.1 F (36.7 C)     TempSrc: Oral     SpO2: 98% 99% 96% 98%   General: WDWN, Alert and oriented x3.  Eyes: EOMI, PERRL, conjunctivae normal. Sclera nonicteric HENT:  [place]/AT, external ears normal.  Nares patent without epistasis.  Mucous membranes are moist. Neck: Soft, normal range of motion, supple, no masses, no thyromegaly. Trachea midline Respiratory: clear to auscultation bilaterally, no wheezing, no crackles. Normal respiratory effort. No accessory muscle use.  Cardiovascular: Regular rate and rhythm, 2/6 Systolic murmer. no rubs / gallops. Mild lower extremity edema. 1+ pedal pulses. Abdomen: Soft, no tenderness, nondistended, no rebound or guarding.  No masses palpated. No hepatosplenomegaly. Bowel sounds normoactive Musculoskeletal: FROM. no cyanosis. No joint deformity upper and lower extremities. Normal muscle tone.  Skin: Warm, dry, intact no  rashes, lesions, ulcers. No induration Neurologic: CN 2-12 grossly intact. Normal speech. Sensation intact, Strength 5/5 in all extremities.   Psychiatric: Normal judgment and insight.  Normal mood.    Labs on Admission: I have personally reviewed following labs and imaging studies  CBC: Recent Labs  Lab 04/15/21 1426  WBC 9.4  NEUTROABS 6.4  HGB 13.4  HCT 40.4  MCV 89.6  PLT 260    Basic Metabolic Panel: Recent Labs  Lab 04/15/21 1426 04/15/21 1641  NA 137  --   K 3.1*  --   CL 103  --   CO2 24  --   GLUCOSE 204*  --   BUN 9  --   CREATININE 0.95  --   CALCIUM 8.5*  --   MG  --  2.1    GFR: CrCl cannot be calculated (Unknown ideal weight.).  Liver Function Tests: Recent Labs  Lab 04/15/21 1641  AST 26  ALT 22  ALKPHOS 108  BILITOT 0.9  PROT 7.2  ALBUMIN 3.4*    Urine analysis:    Component Value Date/Time   COLORURINE YELLOW (A) 05/08/2019 1834   APPEARANCEUR CLEAR (A) 05/08/2019 1834    LABSPEC 1.008 05/08/2019 1834   PHURINE 7.0 05/08/2019 1834   GLUCOSEU 50 (A) 05/08/2019 1834   HGBUR NEGATIVE 05/08/2019 1834   BILIRUBINUR NEGATIVE 05/08/2019 1834   KETONESUR 5 (A) 05/08/2019 1834   PROTEINUR NEGATIVE 05/08/2019 1834   NITRITE NEGATIVE 05/08/2019 1834   LEUKOCYTESUR NEGATIVE 05/08/2019 1834    Radiological Exams on Admission: DG Chest 2 View  Result Date: 04/15/2021 CLINICAL DATA:  Shortness of breath EXAM: CHEST - 2 VIEW COMPARISON:  11/16/2020 FINDINGS: Cardiac shadow is stable. Lungs are well aerated bilaterally. No focal infiltrate or effusion is seen. No acute bony abnormality is noted. Degenerative changes of the thoracic spine are seen. IMPRESSION: No acute abnormality noted. Electronically Signed   By: Alcide CleverMark  Lukens M.D.   On: 04/15/2021 15:21   VAS US LOWER EXTREMITY VENOUS (DVT) (ONLY [facility] & [facility] 7a-7p)  Result Date: 04/15/2021  Lower Venous DVT Study Patient Name:  Micheal Hardin  Date of Exam:   04/15/2021 Medical Rec #: [id number]          Accession #:    [id number] Date of Birth: [birth year]          Patient Gender: M Patient Age:   60Y [facility]  Procedure:      VAS US LOWER EXTREMITY VENOUS (DVT) Referring Phys: [id number] Earnstine RegalELIZABETH W HAMMOND --------------------------------------------------------------------------------  Indications: Pain.  Comparison Study: No previous exams Performing Technologist: Ernestene MentionJody Hill  Examination Guidelines: A complete evaluation includes B-mode imaging, spectral Doppler, color Doppler, and power Doppler as needed of all accessible portions of each vessel. Bilateral testing is considered an integral part of a complete examination. Limited examinations for reoccurring indications may be performed as noted. The reflux portion of the exam is performed with the patient in reverse Trendelenburg.  +---------+---------------+---------+-----------+----------+--------------+ RIGHT     CompressibilityPhasicitySpontaneityPropertiesThrombus Aging +---------+---------------+---------+-----------+----------+--------------+ CFV      Full           Yes      Yes                                 +---------+---------------+---------+-----------+----------+--------------+ SFJ      Full                                                        +---------+---------------+---------+-----------+----------+--------------+  FV Prox  Full           Yes      Yes                                 +---------+---------------+---------+-----------+----------+--------------+ FV Mid   Full           Yes      Yes                                 +---------+---------------+---------+-----------+----------+--------------+ FV DistalFull           Yes      Yes                                 +---------+---------------+---------+-----------+----------+--------------+ PFV      Full                                                        +---------+---------------+---------+-----------+----------+--------------+ POP      Full           Yes      Yes                                 +---------+---------------+---------+-----------+----------+--------------+ PTV      Full                                                        +---------+---------------+---------+-----------+----------+--------------+ PERO     Full                                                        +---------+---------------+---------+-----------+----------+--------------+   +----+---------------+---------+-----------+----------+--------------+ LEFTCompressibilityPhasicitySpontaneityPropertiesThrombus Aging +----+---------------+---------+-----------+----------+--------------+ CFV Full           Yes      Yes                                 +----+---------------+---------+-----------+----------+--------------+     Summary: RIGHT: - There is no evidence of deep vein thrombosis in the lower  extremity. - There is no evidence of superficial venous thrombosis.  - No cystic structure found in the popliteal fossa.  LEFT: - No evidence of common femoral vein obstruction.  *See table(s) above for measurements and observations.    Preliminary     EKG: Independently reviewed.  EKG shows normal sinus rhythm with LVH.  Nonspecific ST changes.  No acute ST elevation or depression.  QTc borderline prolonged at 489  Assessment/Plan Principal Problem:   Acute on chronic systolic CHF (congestive heart failure) Mr. Robb is admitted to cardiac telemetry floor.  29.  Repeat troponin in the ER was 25.  Check serial troponin levels BNP elevated at 312.2.  Last  echocardiogram was June 2020 which showed an EF of 20 to 25% Check echocardiogram in the morning to evaluate wall motion, EF, valvular structures.  Continue Coreg twice a day.  Start Entresto. Monitor daily weights and I&Os With pt having SOB, D-dimer has been ordered in the ER and if it is elevated will check CTA chest to make sure no PE present. Pt had negative vascular US of legs in ER.  Active Problems:   Chest pain Check serial troponin levels. NTG as needed for CP. Echo in am States he had a cardiac catheterization in October November 2021 and did not need stents.  Reports catheterization was done in the [place] area. He reports his daughter is going to try and get the records to bring to the hospital tomorrow.    Diabetes mellitus type 2, uncontrolled  Check hemoglobin A1c.  Monitor blood sugars with meals and at bedtime.  Sliding scale insulin for glycemic control overnight.  Patient states he used to take Januvia. Will initiate Jardiance for diabetic therapy as it has been proven to improve outcomes in CHF patients with or without DM. Will need diabetes education while in hospital.     Hypokalemia Magnesium level is normal. Oral potassium supplemention started. Monitor electrolytes and renal function with labs in am.    DVT  prophylaxis: Lovenox for DVT prophylaxis Code Status:   Full code Family Communication:  Diagnosis and plan discussed with patient.  Patient verbalized understanding agrees with plan.  Further recommendations to follow as clinically indicated Disposition Plan:   Patient is from:  Home  Anticipated DC to:  Home  Anticipated DC date:  Anticipate 2 midnight stay in the hospital to treat acute condition  Anticipated DC barriers: No barriers to discharge identified at this time  Admission status:  Inpatient   Claudean Severance Felita Bump MD [facility]  How to contact the [facility] Attending or Consulting provider 7A - 7P or covering provider during after hours 7P -7A, for this patient?   1. Check the care team in [facility] and look for a) attending/consulting TR[facility] provider listed and b) the [facility] team listed 2. Log into [email or web address] and use 's universal password to access. If you do not have the password, please contact the hospital operator. 3. Locate the [facility] provider you are looking for under [facility] and page to a number that you can be directly reached. 4. If you still have difficulty reaching the provider, please page the [facility] (Director on Call) for the Hospitalists listed on amion for assistance.  04/15/2021, 8:29 PM   Addendum: D-dimer level is normal at 0.50.

## 2021-04-15 NOTE — ED Notes (Signed)
RN attempted to call report x1 

## 2021-04-15 NOTE — Progress Notes (Signed)
RLE venous duplex has been completed.  Lyndel Safe, [place] given preliminary results.  Results can be found under chart review under CV PROC. 04/15/2021 5:41 PM Daniell Mancinas RVT, RDMS

## 2021-04-15 NOTE — ED Notes (Signed)
Pt ambulated with HR of 126, O2 at 96% and RR at 27.

## 2021-04-15 NOTE — ED Provider Notes (Signed)
Emergency Medicine Provider Triage Evaluation Note  Micheal Hardin , a 60 y.o. male  was evaluated in triage.  Pt complains of sob.  Review of Systems  Positive: Sob, fatigue, cough Negative: Fever, worsening cp, fluid retention  Physical Exam  BP 107/76 (BP Location: Right Arm)   Pulse 90   Temp (!) 97.5 F (36.4 C) (Oral)   Resp 17   SpO2 96%  Gen:   Awake, no distress   Resp:  Normal effort  MSK:   Moves extremities without difficulty  Other:  No significant peripheral edema appreciated  Medical Decision Making  Medically screening exam initiated at 2:22 PM.  Appropriate orders placed.  Zein Helbing was informed that the remainder of the evaluation will be completed by another provider, this initial triage assessment does not replace that evaluation, and the importance of remaining in the ED until their evaluation is complete.  Increase SOB x 1 week, hx of CHF.  Worsening with exertion and with sleeping. No fever.    Fayrene Helper, PA-C 04/15/21 1426    Terrilee Files, MD 04/16/21 [phone]

## 2021-04-15 NOTE — ED Provider Notes (Signed)
[facility] EMERGENCY DEPARTMENT Provider Note   CSN: [id number] Arrival date & time: 04/15/21  1355     History No chief complaint on file.   Micheal Hardin is a 60 y.o. male with a past medical history of hypertension, diabetes, CHF, dilated cardiomyopathy, per pulmonology note on 01/14/2021 EF is 20 to 25% with moderate to severe mitral valve regurgitation who presents today for evaluation of worsening shortness of breath. He states that over the past week he has had significant worsening in his shortness of breath. He states that he is still taking his carvedilol and his Lasix however has been out of all of his other medications. He states that his shortness of breath is worse in the morning.  He states that he does not feel like his legs are newly swollen however does note that the right leg is greater in size than the left. He states that when he takes his Lasix after waking up that does improve his shortness of breath some however it is gradually worsening over the past week irregardless of this. It appears that the last time he was seen by internal medicine was in December 2021 with [facility].  The last echo I can see was from 2 years ago.    He states that he has been out of all of his other medications for about a month.  He does not know specifically which meds he is out of, his doses or what he is supposed to be on. This includes he does not know if he is supposed to be taking digoxin.  HPI     Past Medical History:  Diagnosis Date  . Arthritis   . CHF (congestive heart failure) (HCC)   . Diabetes mellitus without complication (HCC)   . Hypertension     Patient Active Problem List   Diagnosis Date Noted  . Acute on chronic systolic CHF (congestive heart failure) (HCC) 04/15/2021  . Chest pain 04/15/2021  . Diabetes mellitus type 2, uncontrolled (HCC) 04/15/2021  . Hypokalemia 04/15/2021  . Intractable nausea and vomiting 05/08/2019    Past  Surgical History:  Procedure Laterality Date  . SPLENECTOMY, TOTAL    . TONSILLECTOMY         History reviewed. No pertinent family history.  Social History   Tobacco Use  . Smoking status: Never Smoker  . Smokeless tobacco: Never Used  Substance Use Topics  . Alcohol use: Never  . Drug use: Yes    Types: Marijuana    Home Medications Prior to Admission medications   Medication Sig Start Date End Date Taking? Authorizing Provider  carvedilol (COREG) 6.25 MG tablet Take 6.25 mg by mouth 2 (two) times daily with a meal.   Yes [provider]  furosemide (LASIX) 40 MG tablet Take 40 mg by mouth every morning.   Yes [provider]  ibuprofen (ADVIL) 200 MG tablet Take 400 mg by mouth every 6 (six) hours as needed (pain).   Yes [provider]    Allergies    Patient has no known allergies.  Review of Systems   Review of Systems  Constitutional: Positive for fatigue. Negative for chills and fever.  Respiratory: Positive for shortness of breath. Negative for chest tightness.   Cardiovascular: Negative for chest pain, palpitations and leg swelling.  Gastrointestinal: Negative for abdominal pain, diarrhea, nausea and vomiting.  Musculoskeletal: Negative for back pain and neck pain.  Neurological: Negative for weakness and headaches.  All other systems reviewed  and are negative.   Physical Exam Updated Vital Signs BP 110/87   Pulse 92   Temp 98.1 F (36.7 C) (Oral)   Resp 19   SpO2 99%   Physical Exam Vitals and nursing note reviewed.  Constitutional:      General: He is not in acute distress.    Appearance: He is not diaphoretic.  HENT:     Head: Normocephalic and atraumatic.  Eyes:     General: No scleral icterus.       Right eye: No discharge.        Left eye: No discharge.     Conjunctiva/sclera: Conjunctivae normal.  Cardiovascular:     Rate and Rhythm: Normal rate and regular rhythm.     Heart sounds: Murmur heard.     Pulmonary:     Effort: Pulmonary effort is normal. No respiratory distress.     Breath sounds: Normal breath sounds. No stridor.  Abdominal:     General: There is no distension.     Tenderness: There is no abdominal tenderness.  Musculoskeletal:        General: No deformity.     Cervical back: Normal range of motion and neck supple.     Right lower leg: Edema present.     Left lower leg: No edema.  Skin:    General: Skin is warm and dry.  Neurological:     Mental Status: He is alert.     Motor: No abnormal muscle tone.     Comments: Patient is awake and alert, answers questions appropriately.  Speech is not slurred.  Psychiatric:        Mood and Affect: Mood normal.        Behavior: Behavior normal.     ED Results / Procedures / Treatments   Labs (all labs ordered are listed, but only abnormal results are displayed) Labs Reviewed  BASIC METABOLIC PANEL - Abnormal; Notable for the following components:      Result Value   Potassium 3.1 (*)    Glucose, Bld 204 (*)    Calcium 8.5 (*)    All other components within normal limits  BRAIN NATRIURETIC PEPTIDE - Abnormal; Notable for the following components:   B Natriuretic Peptide 312.2 (*)    All other components within normal limits  HEPATIC FUNCTION PANEL - Abnormal; Notable for the following components:   Albumin 3.4 (*)    All other components within normal limits  DIGOXIN LEVEL - Abnormal; Notable for the following components:   Digoxin Level <0.2 (*)    All other components within normal limits  TROPONIN I (HIGH SENSITIVITY) - Abnormal; Notable for the following components:   Troponin I (High Sensitivity) 29 (*)    All other components within normal limits  TROPONIN I (HIGH SENSITIVITY) - Abnormal; Notable for the following components:   Troponin I (High Sensitivity) 25 (*)    All other components within normal limits  RESP PANEL BY RT-PCR (FLU A&B, COVID) ARPGX2  CBC WITH DIFFERENTIAL/PLATELET  MAGNESIUM   RAPID URINE DRUG SCREEN, HOSP PERFORMED  D-DIMER, QUANTITATIVE  HIV ANTIBODY (ROUTINE TESTING W REFLEX)  BASIC METABOLIC PANEL  CBC  CREATININE, SERUM  HEMOGLOBIN A1C    EKG None  Radiology DG Chest 2 View  Result Date: 04/15/2021 CLINICAL DATA:  Shortness of breath EXAM: CHEST - 2 VIEW COMPARISON:  11/16/2020 FINDINGS: Cardiac shadow is stable. Lungs are well aerated bilaterally. No focal infiltrate or effusion is seen. No acute bony abnormality is  noted. Degenerative changes of the thoracic spine are seen. IMPRESSION: No acute abnormality noted. Electronically Signed   By: Alcide Clever M.D.   On: 04/15/2021 15:21   VAS [place] LOWER EXTREMITY VENOUS (DVT) (ONLY [facility] & [facility] 7a-7p)  Result Date: 04/15/2021  Lower Venous DVT Study Patient Name:  MOUA RASMUSSON  Date of Exam:   04/15/2021 Medical Rec #: [id number]          Accession #:    [id number] Date of Birth: 1961/10/20          Patient Gender: M Patient Age:   60Y Exam Location:  [facility] Procedure:      VAS [place] LOWER EXTREMITY VENOUS (DVT) Referring Phys: [id number] Earnstine Regal Eban Weick --------------------------------------------------------------------------------  Indications: Pain.  Comparison Study: No previous exams Performing Technologist: Ernestene Mention  Examination Guidelines: A complete evaluation includes B-mode imaging, spectral Doppler, color Doppler, and power Doppler as needed of all accessible portions of each vessel. Bilateral testing is considered an integral part of a complete examination. Limited examinations for reoccurring indications may be performed as noted. The reflux portion of the exam is performed with the patient in reverse Trendelenburg.  +---------+---------------+---------+-----------+----------+--------------+ RIGHT    CompressibilityPhasicitySpontaneityPropertiesThrombus Aging +---------+---------------+---------+-----------+----------+--------------+ CFV      Full           Yes      Yes                                  +---------+---------------+---------+-----------+----------+--------------+ SFJ      Full                                                        +---------+---------------+---------+-----------+----------+--------------+ FV Prox  Full           Yes      Yes                                 +---------+---------------+---------+-----------+----------+--------------+ FV Mid   Full           Yes      Yes                                 +---------+---------------+---------+-----------+----------+--------------+ FV DistalFull           Yes      Yes                                 +---------+---------------+---------+-----------+----------+--------------+ PFV      Full                                                        +---------+---------------+---------+-----------+----------+--------------+ POP      Full           Yes      Yes                                 +---------+---------------+---------+-----------+----------+--------------+  PTV      Full                                                        +---------+---------------+---------+-----------+----------+--------------+ PERO     Full                                                        +---------+---------------+---------+-----------+----------+--------------+   +----+---------------+---------+-----------+----------+--------------+ LEFTCompressibilityPhasicitySpontaneityPropertiesThrombus Aging +----+---------------+---------+-----------+----------+--------------+ CFV Full           Yes      Yes                                 +----+---------------+---------+-----------+----------+--------------+     Summary: RIGHT: - There is no evidence of deep vein thrombosis in the lower extremity. - There is no evidence of superficial venous thrombosis.  - No cystic structure found in the popliteal fossa.  LEFT: - No evidence of common femoral vein obstruction.  *See table(s) above  for measurements and observations.    Preliminary     Procedures Procedures   Medications Ordered in ED Medications  sodium chloride flush (NS) 0.9 % injection 3 mL (has no administration in time range)  sodium chloride flush (NS) 0.9 % injection 3 mL (has no administration in time range)  0.9 %  sodium chloride infusion (has no administration in time range)  acetaminophen (TYLENOL) tablet 650 mg (has no administration in time range)  enoxaparin (LOVENOX) injection 40 mg (has no administration in time range)  potassium chloride SA (KLOR-CON) CR tablet 40 mEq (has no administration in time range)  sacubitril-valsartan (ENTRESTO) 24-26 mg per tablet (has no administration in time range)  carvedilol (COREG) tablet 6.25 mg (has no administration in time range)  aspirin EC tablet 81 mg (has no administration in time range)  insulin aspart (novoLOG) injection 0-9 Units (has no administration in time range)  insulin aspart (novoLOG) injection 0-5 Units (has no administration in time range)  empagliflozin (JARDIANCE) tablet 10 mg (has no administration in time range)  furosemide (LASIX) tablet 80 mg (has no administration in time range)  potassium chloride SA (KLOR-CON) CR tablet 40 mEq (40 mEq Oral Given 04/15/21 1700)    ED Course  I have reviewed the triage vital signs and the nursing notes.  Pertinent labs & imaging results that were available during my care of the patient were reviewed by me and considered in my medical decision making (see chart for details).    MDM Rules/Calculators/A&P                         Patient is a 60 year old man who presents today for evaluation of worsening shortness of breath. He, per his most recent echo 2 years ago, has an EF in the 20s, and has not been following with cardiology.  He has been taking his carvedilol and Lasix however states he has not had any of his other meds in weeks to months. Here his chest x-ray is without pulmonary edema. He is not  having chest pain.  Previous troponins have been  not elevated, however here today his troponins are 29 followed by 25. BNP is slightly elevated at 312.  He is hypokalemic with a potassium of 3.1, and glucose is elevated at 204.  CBC is unremarkable.  COVID and flu testing is negative. Given that patient reports that he does not have a cardiologist or primary care doctor, has been out of his multitude of meds with worsening shortness of breath in the setting of most recent EF in the 20s without a pacemaker he appears to be high risk for complications.  I spoke with hospitalist who will see patient for admission.    The patient appears reasonably stabilized for admission considering the current resources, flow, and capabilities available in the ED at this time, and I doubt any other [facility] requiring further screening and/or treatment in the ED prior to admission assuming timely admission and bed placement.  Note: Portions of this report may have been transcribed using voice recognition software. Every effort was made to ensure accuracy; however, inadvertent computerized transcription errors may be present    Final Clinical Impression(s) / ED Diagnoses Final diagnoses:  Shortness of breath  Patient's noncompliance with other medical treatment and regimen  Congestive heart failure, unspecified HF chronicity, unspecified heart failure type [facility])    Rx / DC Orders ED Discharge Orders    None       Norman Clay 04/15/21 2146    Sabino Donovan, MD 04/15/21 [id number]

## 2021-04-15 NOTE — ED Triage Notes (Signed)
C/o sob, L sided chest pain, fatigue, and cough.  Denies fever.  States SOB increased x 1 week.  Hx of CHF.

## 2021-04-16 ENCOUNTER — Inpatient Hospital Stay (HOSPITAL_COMMUNITY): Payer: Medicare Other

## 2021-04-16 DIAGNOSIS — I5023 Acute on chronic systolic (congestive) heart failure: Secondary | ICD-10-CM | POA: Diagnosis not present

## 2021-04-16 DIAGNOSIS — I110 Hypertensive heart disease with heart failure: Secondary | ICD-10-CM | POA: Diagnosis not present

## 2021-04-16 DIAGNOSIS — R0602 Shortness of breath: Secondary | ICD-10-CM | POA: Diagnosis not present

## 2021-04-16 LAB — GLUCOSE, CAPILLARY
Glucose-Capillary: 144 mg/dL — ABNORMAL HIGH (ref 70–99)
Glucose-Capillary: 199 mg/dL — ABNORMAL HIGH (ref 70–99)
Glucose-Capillary: 216 mg/dL — ABNORMAL HIGH (ref 70–99)
Glucose-Capillary: 161 mg/dL — ABNORMAL HIGH (ref 70–99)

## 2021-04-16 LAB — ECHOCARDIOGRAM COMPLETE
Area-P 1/2: 10.25 cm2
Height: 67 in
S' Lateral: 4.6 cm
Weight: 2897.6 oz

## 2021-04-16 LAB — BASIC METABOLIC PANEL
Anion gap: 8 (ref 5–15)
BUN: 12 mg/dL (ref 8–23)
CO2: 24 mmol/L (ref 22–32)
Calcium: 9.2 mg/dL (ref 8.9–10.3)
Chloride: 104 mmol/L (ref 98–111)
Creatinine, Ser: 0.96 mg/dL (ref 0.61–1.24)
GFR, Estimated: >60 mL/min (ref >60)
Glucose, Bld: 156 mg/dL — ABNORMAL HIGH (ref 70–99)
Potassium: 4.1 mmol/L (ref 3.5–5.1)
Sodium: 136 mmol/L (ref 135–145)

## 2021-04-16 LAB — CREATININE, SERUM
Creatinine, Ser: 1 mg/dL (ref 0.61–1.24)
GFR, Estimated: >60 mL/min (ref >60)

## 2021-04-16 LAB — HIV ANTIBODY (ROUTINE TESTING W REFLEX): HIV Screen 4th Generation wRfx: NONREACTIVE

## 2021-04-16 MED ORDER — TRAZODONE HCL 50 MG PO TABS
50.0000 mg | ORAL_TABLET | Freq: Every evening | ORAL | Status: DC | PRN
  Administered 2021-04-17 – 2021-04-18 (×2): 50 mg via ORAL
  Filled 2021-04-16 (×2): qty 1

## 2021-04-16 MED ORDER — GUAIFENESIN-DM 100-10 MG/5ML PO SYRP
5.0000 mL | ORAL_SOLUTION | ORAL | Status: DC | PRN
  Administered 2021-04-16: 5 mL via ORAL
  Filled 2021-04-16: qty 5

## 2021-04-16 MED ORDER — FUROSEMIDE 10 MG/ML IJ SOLN
40.0000 mg | Freq: Two times a day (BID) | INTRAMUSCULAR | Status: DC
  Administered 2021-04-16: 40 mg via INTRAVENOUS
  Filled 2021-04-16: qty 4

## 2021-04-16 NOTE — TOC Initial Note (Signed)
Transition of Care [facility]) - Initial/Assessment Note    Patient Details  Name: Micheal Hardin MRN: [id number] Date of Birth: 1961-10-15  Transition of Care [facility]) CM/SW Contact:    Epifanio Lesches, RN Phone Number: 04/16/2021, 3:47 PM  Clinical Narrative:                 Presents with SOB/ CP, hx of  HTN, CHF( EF of 20-25% on ) June 2020, DM. From home with daughter and son in law. States PTA independent with ADL's. States recently moved from [place] to [place].  Pt without PCP ... NCM to f/u on tomorrow with a  [facility] for an appointment to establish primary care.  TOC team following and will assist with needs...   Expected Discharge Plan: Home/Self Care     Patient Goals and CMS Choice Patient states their goals for this hospitalization and ongoing recovery are:: to breath better      Expected Discharge Plan and Services Expected Discharge Plan: Home/Self Care   Discharge Planning Services: CM Consult   Living arrangements for the past 2 months: Apartment                                      Prior Living Arrangements/Services Living arrangements for the past 2 months: Apartment Lives with:: Adult Children Patient language and need for interpreter reviewed:: Yes              Criminal Activity/Legal Involvement Pertinent to Current Situation/Hospitalization: No - Comment as needed  Activities of Daily Living Home Assistive Devices/Equipment: CBG Meter ADL Screening (condition at time of admission) Patient's cognitive ability adequate to safely complete daily activities?: Yes Is the patient deaf or have difficulty hearing?: No Does the patient have difficulty seeing, even when wearing glasses/contacts?: No Does the patient have difficulty concentrating, remembering, or making decisions?: No Patient able to express need for assistance with ADLs?: Yes Does the patient have difficulty dressing or bathing?: No Independently performs ADLs?: Yes  (appropriate for developmental age) Does the patient have difficulty walking or climbing stairs?: No Weakness of Legs: None Weakness of Arms/Hands: None  Permission Sought/Granted   Permission granted to share information with : Yes, Verbal Permission Granted  Share Information with NAME: Cindra Presume (Daughter)   [phone]           Emotional Assessment       Orientation: : Oriented to Situation,Oriented to  Time,Oriented to Place Alcohol / Substance Use: Not Applicable Psych Involvement: No (comment)  Admission diagnosis:  Shortness of breath [R06.02] Acute on chronic systolic CHF (congestive heart failure) (HCC) [I50.23] Patient's noncompliance with other medical treatment and regimen [Z91.19] Congestive heart failure, unspecified HF chronicity, unspecified heart failure type [facility]) [I50.9] Patient Active Problem List   Diagnosis Date Noted  . Acute on chronic systolic CHF (congestive heart failure) (HCC) 04/15/2021  . Chest pain 04/15/2021  . Diabetes mellitus type 2, uncontrolled (HCC) 04/15/2021  . Hypokalemia 04/15/2021  . Intractable nausea and vomiting 05/08/2019   PCP:  Patient, No Pcp Per (Inactive) Pharmacy:   [facility] [address] [place] - [address] [place] [address] Phone: [phone] Fax: [phone]     Social Determinants of Health (SDOH) Interventions    Readmission Risk Interventions No flowsheet data found.

## 2021-04-16 NOTE — Progress Notes (Signed)
FLU shot 2021

## 2021-04-16 NOTE — Plan of Care (Signed)
  Problem: Education: Goal: Ability to demonstrate management of disease process will improve Outcome: Progressing Goal: Ability to verbalize understanding of medication therapies will improve Outcome: Progressing   Problem: Activity: Goal: Capacity to carry out activities will improve Outcome: Progressing   Problem: Cardiac: Goal: Ability to achieve and maintain adequate cardiopulmonary perfusion will improve Outcome: Progressing  Patient states comprehends rational for medication regimen "better".  Also states he understands that he should do his best to maintain his levels of medication to avoid future problems.  Echocardiogram completed.  See results.

## 2021-04-16 NOTE — Hospital Course (Addendum)
Micheal Hardin is a 60 y.o. male with medical history significant for HTN, Systolic CHF with EF of 20-25% on echo from June 2020, DMT2 (last HgbA1c was 7.5 over 6 months ago)-not on therapy at this time who presents with complaint of worsening shortness of breath, orthopnea/PND and substernal chest pain.  (See full H&P for full history.)  Pt had cardiac cath in Nov 2021 in [place], [place], no stents placed.  He recently moved to this area and has yet to establish with doctors here locally.    Admitted to [facility] service for management of acute on chronic systolic CHF with orthopnea/PND, shortness of breath initially on exertion now at rest, elevated BNP.  Troponin very mildly elevated with flat trend.

## 2021-04-16 NOTE — Progress Notes (Signed)
  Echocardiogram 2D Echocardiogram has been performed.  Micheal Hardin 04/16/2021, 3:26 PM

## 2021-04-16 NOTE — Progress Notes (Signed)
PROGRESS NOTE    Micheal Hardin   ERX:[id number]  DOB: September 08, 1961  PCP: Patient, No Pcp Per (Inactive)    DOA: 04/15/2021 LOS: 1   Brief Narrative    Micheal Hardin is a 60 y.o. male with medical history significant for HTN, Systolic CHF with EF of 20-25% on echo from June 2020, DMT2 (last HgbA1c was 7.5 over 6 months ago)-not on therapy at this time who presents with complaint of worsening shortness of breath, orthopnea/PND and substernal chest pain.  (See full H&P for full history.)  Pt had cardiac cath in Nov 2021 in [place], [place], no stents placed.  He recently moved to this area and has yet to establish with doctors here locally.    Admitted to [facility] service for management of acute on chronic systolic CHF with orthopnea/PND, shortness of breath initially on exertion now at rest, elevated BNP.  Troponin very mildly elevated with flat trend.      Assessment & Plan   Principal Problem:   Acute on chronic systolic CHF (congestive heart failure) (HCC) Active Problems:   Chest pain   Diabetes mellitus type 2, uncontrolled (HCC)   Hypokalemia   Acute on chronic systolic CHF - p/w progress SOB/DOE, orthopnea/PND, BNP elevated at 312.2.   Last echocardiogram June 2020 - EF of 20 to 25% D-Dimer negative and negative LE doppler US for DVT in ED --Follow up pending Echo --Continue Coreg  --Started Entresto --Monitor daily weights and I&Os --Lasix 40 mg IV BID --Discussed with Dr. Sharyn Lull, will see in close outpatient follow up. Currently no need for full inpatient consult.  Info placed in d/c follow up instructions.  Chest pain - Trop 29>>25.  CP resulted. Pt reports had a cardiac cath in Fall of 2021 in [place], did not need stents.  Daughter is going to try and get the records to bring to the hospital. -- stat EKG and repeat troponin if CP recurrent -- PRN sublingual nitro --Continue ASA, Coreg --Lipid panel with AM labs, appears not on statin   Diabetes mellitus type  2, uncontrolled -   -- Pending hemoglobin A1c -- Sliding scale Novolog -- Started on Jardiance (used to take Januvia, not on for 6-7 mo) -- Diabetes educator consulted  Hypokalemia - Mg normal. Replaced K.   --Monitor BMP  Patient BMI: Body mass index is 28.36 kg/m.   DVT prophylaxis: enoxaparin (LOVENOX) injection 40 mg Start: 04/15/21 2200   Diet:  Diet Orders (From admission, onward)    Start     Ordered   04/15/21 2115  Diet heart healthy/carb modified Room service appropriate? Yes; Fluid consistency: Thin  Diet effective now       Question Answer Comment  Diet-HS Snack? Nothing   Room service appropriate? Yes   Fluid consistency: Thin      04/15/21 2114            Code Status: Full Code    Subjective 04/16/21    Pt feels a little better today.  Still having bouts of severe SOB and he gets short of breath with conversation during encounter.  No other acute complaints.     Disposition Plan & Communication   Status is: Inpatient  Remains inpatient appropriate because:IV treatments appropriate due to intensity of illness or inability to take PO   Dispo: The patient is from: Home              Anticipated d/c is to: Home  Patient currently is not medically stable to d/c.   Difficult to place patient No   Consults, Procedures, Significant Events   Consultants:   none  Procedures:   Echo - pending  Antimicrobials:  Anti-infectives (From admission, onward)   None        Micro    Objective   Vitals:   04/16/21 0006 04/16/21 0401 04/16/21 0801 04/16/21 1102  BP: (!) 138/94 (!) 124/93 (!) 130/101 114/85  Pulse: 94 90 91 85  Resp: 18 18 18 17   Temp: 98 F (36.7 C) 97.7 F (36.5 C) 97.7 F (36.5 C) (!) 97.3 F (36.3 C)  TempSrc: Oral Oral Oral Oral  SpO2: 96% 99% 93% 100%  Weight:  82.1 kg    Height:        Intake/Output Summary (Last 24 hours) at 04/16/2021 1447 Last data filed at 04/16/2021 1300 Gross per 24 hour  Intake  700 ml  Output 2475 ml  Net -1775 ml   Filed Weights   04/15/21 2202 04/16/21 0401  Weight: 82.5 kg 82.1 kg    Physical Exam:  General exam: awake, alert, no acute distress HEENT: moist mucus membranes, hearing grossly normal  Respiratory system: faint basilar crackles, no wheezes, rales or rhonchi, normal respiratory effort. Cardiovascular system: normal S1/S2, RRR, trace BLE edema.   Gastrointestinal system: soft, NT, ND Central nervous system: A&O x3. no gross focal neurologic deficits, normal speech Skin: dry, intact, normal temperature, normal color, No rashes, lesions or ulcers seen on visualized skin Psychiatry: normal mood, congruent affect, judgement and insight appear normal  Labs   Data Reviewed: I have personally reviewed following labs and imaging studies  CBC: Recent Labs  Lab 04/15/21 1426 04/15/21 2327  WBC 9.4 8.7  NEUTROABS 6.4  --   HGB 13.4 13.8  HCT 40.4 40.9  MCV 89.6 89.7  PLT 260 255   Basic Metabolic Panel: Recent Labs  Lab 04/15/21 1426 04/15/21 1641 04/15/21 2327 04/16/21 0217  NA 137  --   --  136  K 3.1*  --   --  4.1  CL 103  --   --  104  CO2 24  --   --  24  GLUCOSE 204*  --   --  156*  BUN 9  --   --  12  CREATININE 0.95  --  1.00 0.96  CALCIUM 8.5*  --   --  9.2  MG  --  2.1  --   --    GFR: Estimated Creatinine Clearance: 81.8 mL/min (by C-G formula based on SCr of 0.96 mg/dL). Liver Function Tests: Recent Labs  Lab 04/15/21 1641  AST 26  ALT 22  ALKPHOS 108  BILITOT 0.9  PROT 7.2  ALBUMIN 3.4*   No results for input(s): LIPASE, AMYLASE in the last 168 hours. No results for input(s): AMMONIA in the last 168 hours. Coagulation Profile: No results for input(s): INR, PROTIME in the last 168 hours. Cardiac Enzymes: No results for input(s): CKTOTAL, CKMB, CKMBINDEX, TROPONINI in the last 168 hours. BNP (last 3 results) No results for input(s): PROBNP in the last 8760 hours. HbA1C: No results for input(s): HGBA1C  in the last 72 hours. CBG: Recent Labs  Lab 04/15/21 2233 04/16/21 0612 04/16/21 1102  GLUCAP 158* 144* 199*   Lipid Profile: No results for input(s): CHOL, HDL, LDLCALC, TRIG, CHOLHDL, LDLDIRECT in the last 72 hours. Thyroid Function Tests: No results for input(s): TSH, T4TOTAL, FREET4, T3FREE, THYROIDAB in the last  72 hours. Anemia Panel: No results for input(s): VITAMINB12, FOLATE, FERRITIN, TIBC, IRON, RETICCTPCT in the last 72 hours. Sepsis Labs: No results for input(s): PROCALCITON, LATICACIDVEN in the last 168 hours.  Recent Results (from the past 240 hour(s))  Resp Panel by RT-PCR (Flu A&B, Covid) Nasopharyngeal Swab     Status: None   Collection Time: 04/15/21  5:01 PM   Specimen: Nasopharyngeal Swab; Nasopharyngeal(NP) swabs in vial transport medium  Result Value Ref Range Status   SARS Coronavirus 2 by RT PCR NEGATIVE NEGATIVE Final    Comment: (NOTE) SARS-CoV-2 target nucleic acids are NOT DETECTED.  The SARS-CoV-2 RNA is generally detectable in upper respiratory specimens during the acute phase of infection. The lowest concentration of SARS-CoV-2 viral copies this assay can detect is 138 copies/mL. A negative result does not preclude SARS-Cov-2 infection and should not be used as the sole basis for treatment or other patient management decisions. A negative result may occur with  improper specimen collection/handling, submission of specimen other than nasopharyngeal swab, presence of viral mutation(s) within the areas targeted by this assay, and inadequate number of viral copies(<138 copies/mL). A negative result must be combined with clinical observations, patient history, and epidemiological information. The expected result is Negative.  Fact Sheet for Patients:  BloggerCourse.comhttps://www.fda.gov/media/152166/download  Fact Sheet for Healthcare Providers:  SeriousBroker.ithttps://www.fda.gov/media/152162/download  This test is no t yet approved or cleared by the [place] FDA and   has been authorized for detection and/or diagnosis of SARS-CoV-2 by FDA under an Emergency Use Authorization (EUA). This EUA will remain  in effect (meaning this test can be used) for the duration of the COVID-19 declaration under Section 564(b)(1) of the Act, 21 U.S.C.section 360bbb-3(b)(1), unless the authorization is terminated  or revoked sooner.       Influenza A by PCR NEGATIVE NEGATIVE Final   Influenza B by PCR NEGATIVE NEGATIVE Final    Comment: (NOTE) The Xpert Xpress SARS-CoV-2/FLU/RSV plus assay is intended as an aid in the diagnosis of influenza from Nasopharyngeal swab specimens and should not be used as a sole basis for treatment. Nasal washings and aspirates are unacceptable for Xpert Xpress SARS-CoV-2/FLU/RSV testing.  Fact Sheet for Patients: BloggerCourse.comhttps://www.fda.gov/media/152166/download  Fact Sheet for Healthcare Providers: SeriousBroker.ithttps://www.fda.gov/media/152162/download  This test is not yet approved or cleared by the [place] FDA and has been authorized for detection and/or diagnosis of SARS-CoV-2 by FDA under an Emergency Use Authorization (EUA). This EUA will remain in effect (meaning this test can be used) for the duration of the COVID-19 declaration under Section 564(b)(1) of the Act, 21 U.S.C. section 360bbb-3(b)(1), unless the authorization is terminated or revoked.  Performed at [facility], [address] [place], [place] [address]       Imaging Studies   DG Chest 2 View  Result Date: 04/15/2021 CLINICAL DATA:  Shortness of breath EXAM: CHEST - 2 VIEW COMPARISON:  11/16/2020 FINDINGS: Cardiac shadow is stable. Lungs are well aerated bilaterally. No focal infiltrate or effusion is seen. No acute bony abnormality is noted. Degenerative changes of the thoracic spine are seen. IMPRESSION: No acute abnormality noted. Electronically Signed   By: Alcide CleverMark  Lukens M.D.   On: 04/15/2021 15:21   VAS US LOWER EXTREMITY VENOUS (DVT) (ONLY [facility] & [facility]  7a-7p)  Result Date: 04/16/2021  Lower Venous DVT Study Patient Name:  Micheal Hardin  Date of Exam:   04/15/2021 Medical Rec #: [id number]          Accession #:  [id number] Date of Birth: 1961/01/18          Patient Gender: M Patient Age:   60Y Exam Location:  [facility] Procedure:      VAS [place] LOWER EXTREMITY VENOUS (DVT) Referring Phys: [id number] Earnstine Regal HAMMOND --------------------------------------------------------------------------------  Indications: Pain.  Comparison Study: No previous exams Performing Technologist: Ernestene Mention  Examination Guidelines: A complete evaluation includes B-mode imaging, spectral Doppler, color Doppler, and power Doppler as needed of all accessible portions of each vessel. Bilateral testing is considered an integral part of a complete examination. Limited examinations for reoccurring indications may be performed as noted. The reflux portion of the exam is performed with the patient in reverse Trendelenburg.  +---------+---------------+---------+-----------+----------+--------------+ RIGHT    CompressibilityPhasicitySpontaneityPropertiesThrombus Aging +---------+---------------+---------+-----------+----------+--------------+ CFV      Full           Yes      Yes                                 +---------+---------------+---------+-----------+----------+--------------+ SFJ      Full                                                        +---------+---------------+---------+-----------+----------+--------------+ FV Prox  Full           Yes      Yes                                 +---------+---------------+---------+-----------+----------+--------------+ FV Mid   Full           Yes      Yes                                 +---------+---------------+---------+-----------+----------+--------------+ FV DistalFull           Yes      Yes                                  +---------+---------------+---------+-----------+----------+--------------+ PFV      Full                                                        +---------+---------------+---------+-----------+----------+--------------+ POP      Full           Yes      Yes                                 +---------+---------------+---------+-----------+----------+--------------+ PTV      Full                                                        +---------+---------------+---------+-----------+----------+--------------+ PERO     Full                                                        +---------+---------------+---------+-----------+----------+--------------+   +----+---------------+---------+-----------+----------+--------------+  LEFTCompressibilityPhasicitySpontaneityPropertiesThrombus Aging +----+---------------+---------+-----------+----------+--------------+ CFV Full           Yes      Yes                                 +----+---------------+---------+-----------+----------+--------------+     Summary: RIGHT: - There is no evidence of deep vein thrombosis in the lower extremity. - There is no evidence of superficial venous thrombosis.  - No cystic structure found in the popliteal fossa.  LEFT: - No evidence of common femoral vein obstruction.  *See table(s) above for measurements and observations. Electronically signed by Sherald Hess MD on 04/16/2021 at 9:45:05 AM.    Final      Medications   Scheduled Meds: . aspirin EC  81 mg Oral Daily  . carvedilol  6.25 mg Oral BID WC  . empagliflozin  10 mg Oral Daily  . enoxaparin (LOVENOX) injection  40 mg Subcutaneous Q24H  . furosemide  40 mg Intravenous BID  . insulin aspart  0-5 Units Subcutaneous QHS  . insulin aspart  0-9 Units Subcutaneous TID WC  . potassium chloride  40 mEq Oral Daily  . sacubitril-valsartan  1 tablet Oral BID  . sodium chloride flush  3 mL Intravenous Q12H   Continuous Infusions: . sodium  chloride         LOS: 1 day    Time spent: 30 minutes    Pennie Banter, DO [facility]  04/16/2021, 2:47 PM      If 7PM-7AM, please contact night-coverage. How to contact the [facility] Attending or Consulting provider 7A - 7P or covering provider during after hours 7P -7A, for this patient?    1. Check the care team in [facility] and look for a) attending/consulting TRH provider listed and b) the [facility] team listed 2. Log into [email or web address] and use [place]'s universal password to access. If you do not have the password, please contact the hospital operator. 3. Locate the [facility] provider you are looking for under [facility] and page to a number that you can be directly reached. 4. If you still have difficulty reaching the provider, please page the [facility] (Director on Call) for the Hospitalists listed on amion for assistance.

## 2021-04-17 ENCOUNTER — Other Ambulatory Visit (HOSPITAL_COMMUNITY): Payer: Self-pay

## 2021-04-17 DIAGNOSIS — I5023 Acute on chronic systolic (congestive) heart failure: Secondary | ICD-10-CM | POA: Diagnosis not present

## 2021-04-17 DIAGNOSIS — R0602 Shortness of breath: Secondary | ICD-10-CM | POA: Diagnosis not present

## 2021-04-17 DIAGNOSIS — E876 Hypokalemia: Secondary | ICD-10-CM | POA: Diagnosis not present

## 2021-04-17 DIAGNOSIS — R079 Chest pain, unspecified: Secondary | ICD-10-CM | POA: Diagnosis not present

## 2021-04-17 DIAGNOSIS — I110 Hypertensive heart disease with heart failure: Secondary | ICD-10-CM | POA: Diagnosis not present

## 2021-04-17 DIAGNOSIS — E1165 Type 2 diabetes mellitus with hyperglycemia: Secondary | ICD-10-CM | POA: Diagnosis not present

## 2021-04-17 LAB — BASIC METABOLIC PANEL
Anion gap: 10 (ref 5–15)
BUN: 20 mg/dL (ref 8–23)
CO2: 23 mmol/L (ref 22–32)
Calcium: 8.8 mg/dL — ABNORMAL LOW (ref 8.9–10.3)
Chloride: 100 mmol/L (ref 98–111)
Creatinine, Ser: 1.26 mg/dL — ABNORMAL HIGH (ref 0.61–1.24)
GFR, Estimated: >60 mL/min (ref >60)
Glucose, Bld: 270 mg/dL — ABNORMAL HIGH (ref 70–99)
Potassium: 3.4 mmol/L — ABNORMAL LOW (ref 3.5–5.1)
Sodium: 133 mmol/L — ABNORMAL LOW (ref 135–145)

## 2021-04-17 LAB — LIPID PANEL
Cholesterol: 206 mg/dL — ABNORMAL HIGH (ref 0–200)
HDL: 33 mg/dL — ABNORMAL LOW (ref >40)
LDL Cholesterol: 121 mg/dL — ABNORMAL HIGH (ref 0–99)
Total CHOL/HDL Ratio: 6.2 RATIO
Triglycerides: 259 mg/dL — ABNORMAL HIGH (ref <150)
VLDL: 52 mg/dL — ABNORMAL HIGH (ref 0–40)

## 2021-04-17 LAB — GLUCOSE, CAPILLARY
Glucose-Capillary: 199 mg/dL — ABNORMAL HIGH (ref 70–99)
Glucose-Capillary: 220 mg/dL — ABNORMAL HIGH (ref 70–99)
Glucose-Capillary: 171 mg/dL — ABNORMAL HIGH (ref 70–99)
Glucose-Capillary: 204 mg/dL — ABNORMAL HIGH (ref 70–99)

## 2021-04-17 LAB — HEMOGLOBIN A1C
Hgb A1c MFr Bld: 8.1 % — ABNORMAL HIGH (ref 4.8–5.6)
Mean Plasma Glucose: 186 mg/dL

## 2021-04-17 MED ORDER — LIVING WELL WITH DIABETES BOOK
Freq: Once | Status: AC
  Filled 2021-04-17: qty 1

## 2021-04-17 MED ORDER — POTASSIUM CHLORIDE CRYS ER 20 MEQ PO TBCR
40.0000 meq | EXTENDED_RELEASE_TABLET | Freq: Four times a day (QID) | ORAL | Status: AC
  Administered 2021-04-17 (×2): 40 meq via ORAL
  Filled 2021-04-17 (×2): qty 2

## 2021-04-17 MED ORDER — FUROSEMIDE 40 MG PO TABS
40.0000 mg | ORAL_TABLET | Freq: Every day | ORAL | Status: DC
  Administered 2021-04-17 – 2021-04-18 (×2): 40 mg via ORAL
  Filled 2021-04-17 (×2): qty 1

## 2021-04-17 MED ORDER — SPIRONOLACTONE 12.5 MG HALF TABLET
12.5000 mg | ORAL_TABLET | Freq: Every day | ORAL | Status: DC
  Administered 2021-04-17 – 2021-04-18 (×2): 12.5 mg via ORAL
  Filled 2021-04-17 (×2): qty 1

## 2021-04-17 MED ORDER — ATORVASTATIN CALCIUM 40 MG PO TABS
40.0000 mg | ORAL_TABLET | Freq: Every day | ORAL | Status: DC
  Administered 2021-04-17 – 2021-04-18 (×2): 40 mg via ORAL
  Filled 2021-04-17 (×2): qty 1

## 2021-04-17 NOTE — Evaluation (Signed)
Physical Therapy Evaluation and Discharge Patient Details Name: Micheal Hardin MRN: [id number] DOB: July 06, 1961 Today's Date: 04/17/2021   History of Present Illness  Pt is a 60 y.o. M who presents with chest pain and shortness of breath. Admitted 5/29 with HFrEF with exacerbation. Significant PMH: HFrEF, DM2, HTN.  Clinical Impression  PTA, pt lives with his daughter and son in law and is independent. Pt presents with decreased cardiopulmonary endurance in setting of HF. Pt ambulating x 250 feet with no assistive device or physical assist. HR 85-102 bpm, SpO2 99% on RA. HF education provided regarding signs/symptoms of exacerbation, activity recommendations, and diet. Pt with no further acute PT needs. Thank you for this consult.     Follow Up Recommendations No PT follow up    Equipment Recommendations  None recommended by PT    Recommendations for Other Services       Precautions / Restrictions Precautions Precautions: None Restrictions Weight Bearing Restrictions: No      Mobility  Bed Mobility Overal bed mobility: Independent                  Transfers Overall transfer level: Independent Equipment used: None                Ambulation/Gait Ambulation/Gait assistance: Modified independent (Device/Increase time) Gait Distance (Feet): 250 Feet Assistive device: None Gait Pattern/deviations: Step-through pattern;Antalgic Gait velocity: decreased   General Gait Details: Mildly antalgic gait pattern (pt reports chronic BLE pain), but no gross instability noted.  Stairs            Wheelchair Mobility    Modified Rankin (Stroke Patients Only)       Balance Overall balance assessment: Mild deficits observed, not formally tested                                           Pertinent Vitals/Pain Pain Assessment: 0-10 Pain Score: 4  Pain Location: chest, L shoulder Pain Descriptors / Indicators: Constant Pain Intervention(s):  Monitored during session    Home Living Family/patient expects to be discharged to:: Private residence Living Arrangements: Children (daughter, son in Social worker) Available Help at Discharge: Family Type of Home: Apartment Home Access: Level entry     Home Layout: Two level        Prior Function Level of Independence: Independent               Hand Dominance        Extremity/Trunk Assessment   Upper Extremity Assessment Upper Extremity Assessment: Overall WFL for tasks assessed    Lower Extremity Assessment Lower Extremity Assessment: Overall WFL for tasks assessed       Communication   Communication: No difficulties  Cognition Arousal/Alertness: Awake/alert Behavior During Therapy: WFL for tasks assessed/performed Overall Cognitive Status: Within Functional Limits for tasks assessed                                        General Comments      Exercises     Assessment/Plan    PT Assessment Patent does not need any further PT services  PT Problem List         PT Treatment Interventions      PT Goals (Current goals can be found in the Care Plan section)  Acute Rehab PT Goals Patient Stated Goal: improved breathing PT Goal Formulation: With patient Time For Goal Achievement: 05/01/21 Potential to Achieve Goals: Good    Frequency     Barriers to discharge        Co-evaluation               AM-PAC PT "6 Clicks" Mobility  Outcome Measure Help needed turning from your back to your side while in a flat bed without using bedrails?: None Help needed moving from lying on your back to sitting on the side of a flat bed without using bedrails?: None Help needed moving to and from a bed to a chair (including a wheelchair)?: None Help needed standing up from a chair using your arms (e.g., wheelchair or bedside chair)?: None Help needed to walk in hospital room?: None Help needed climbing 3-5 steps with a railing? : A Little 6 Click  Score: 23    End of Session   Activity Tolerance: Patient tolerated treatment well Patient left: in bed;with call bell/phone within reach   PT Visit Diagnosis: Difficulty in walking, not elsewhere classified (R26.2)    Time: 2376-2831 PT Time Calculation (min) (ACUTE ONLY): 20 min   Charges:   PT Evaluation $PT Eval Low Complexity: 1 Low          Micheal Hardin, PT, DPT Acute Rehabilitation Services Pager [phone] Office [phone]   Micheal Hardin 04/17/2021, 5:02 PM

## 2021-04-17 NOTE — Plan of Care (Signed)
  Problem: Education: Goal: Ability to demonstrate management of disease process will improve Outcome: Progressing Goal: Ability to verbalize understanding of medication therapies will improve Outcome: Progressing   Problem: Education: Goal: Knowledge of General Education information will improve Description Including pain rating scale, medication(s)/side effects and non-pharmacologic comfort measures Outcome: Progressing   

## 2021-04-17 NOTE — Progress Notes (Addendum)
Heart Failure Stewardship Pharmacist Progress Note   PCP: Patient, No Pcp Per (Inactive) PCP-Cardiologist: None    HPI:  60 yo male with PMH HTN, systolic CHF, and T2DM presents with SOB and substernal CP. Per MD report, he reported that he got SOB walking to the bathroom and while at rest. He was also feeling nauseous and clammy on admission. He reports that he had a cardiac cath in [facility] in November 2021 but no stents were placed. ECHO on 04/16/21 with LVEF 25-30% which is up from 20-25% in June 2020, RV function normal.  Current HF Medications: PO furosemide 40 mg daily Carvedilol 6.25 mg BID Entresto 24/26 mg BID Jardiance 10 mg daily KCl 40 mEq q6 hrs  Prior to admission HF Medications: Carvedilol 6.25 mg BID PO furosemide 40 mg daily  Pertinent Lab Values: . Serum creatinine 0.96>1.26, BUN 20, Potassium 3.4, Sodium 133  Vital Signs: . Weight: 178 lbs (admission weight: 181.8 lbs) . Blood pressure: 100/70s  . Heart rate: 80s   Medication Assistance / Insurance Benefits Check: Does the patient have prescription insurance?  Yes Type of insurance plan: [employer] Medicare and [place] Medicaid  Outpatient Pharmacy:  Prior to admission outpatient pharmacy: [facility] Is the patient willing to use [facility] TOC pharmacy at discharge? Yes Is the patient willing to transition their outpatient pharmacy to utilize a [facility] outpatient pharmacy?   Pending    Assessment: 1. Acute on chronic systolic CHF (EF 68-34%), due to uncontrolled HTN. NYHA class III-IV symptoms. - Volume status improving with weight down 3 lbs after receiving IV furosemide 40 mg yesterday then switched to PO. I/Os not recorded, transient bump in renal function - Continue PO furosemide 40 mg daily. Potassium replaced. - Continue carvedilol 6.25 mg BID - Continue Entresto 24/26 mg BID - Continue Jardiance 10 mg daily - Consider adding spironolactone 12.5 mg daily   Plan: 1) Medication changes recommended at  this time: - Add spironolactone 12.5 mg daily  2) Patient assistance: - Sherryll Burger copay $4/month - Jardiance copay $4/month  3)  Education  - To be completed prior to discharge - HF TOC appointment scheduled for 04/25/21  Tama Headings, PharmD, BCPS Sharen Hones, PharmD, BCPS Heart Failure Stewardship Pharmacist Phone [phone]

## 2021-04-17 NOTE — Progress Notes (Signed)
PROGRESS NOTE        PATIENT DETAILS Name: Micheal Hardin Age: 60 y.o. Sex: male Date of Birth: 1961-04-28 Admit Date: 04/15/2021 Admitting Physician Carlton Adam, MD ZGY:FVCBSWH, No Pcp Per (Inactive)  Brief Narrative: Patient is a 60 y.o. male with history of HFrEF, DM-2, HTN-presented with chest pain/shortness of breath-found to have HFrEF with exacerbation and admitted to the hospitalist service.  See below for further details.  Significant events: 5/29>> admit for acute on chronic systolic heart failure  Significant studies: 5/29>> CXR: No pneumonia 5/29>> right lower extremity Doppler: Negative for DVT. 5/30>> TTE: EF 25-30%, global hypokinesis, grade 1 diastolic dysfunction  Antimicrobial therapy: None  Microbiology data: 5/29>> COVID/influenza PCR: Negative  Procedures : None  Consults: None  DVT Prophylaxis : enoxaparin (LOVENOX) injection 40 mg Start: 04/15/21 2200   Subjective: Feels better-less shortness of breath.  Assessment/Plan: HFrEF with exacerbation: Volume status improved-stop IV Lasix-transition to oral furosemide, add Aldactone.  Remains on Entresto/Coreg.  Prior MD spoke with cardiology-Dr. Raymondo Band will arrange for outpatient follow-up.    Atypical chest pain: Resolved-probably related to above.  Continue aspirin/beta-blocker-we will add statin.  Per prior notes-patient had a negative LHC in 2021.  AKI: Mild-hemodynamically mediated/diuretics-stable for follow up.  Hypokalemia: Replete and recheck  Hyponatremia: Mild-should improve with ongoing diuretic regimen  HLD: Starting high intensity statin.  DM-2 (A1c 8.1 on 5/29): CBGs relatively stable on SSI-started on Jardiance this admission.  Stable for continued follow-up in the outpatient setting  Recent Labs    04/16/21 2042 04/17/21 0600 04/17/21 1131  GLUCAP 161* 199* 220*    Diet: Diet Order            Diet heart healthy/carb modified  Room service appropriate? Yes; Fluid consistency: Thin  Diet effective now                  Code Status: Full code   Family Communication:  Daughter- Cindra Presume over phone-[phone]-left voicemail on 5/31  Disposition Plan: Status is: Inpatient  Remains inpatient appropriate because:Inpatient level of care appropriate due to severity of illness   Dispo: The patient is from: Home              Anticipated d/c is to: Home              Patient currently is not medically stable to d/c.   Difficult to place patient No   Barriers to Discharge: Watch closely x24 hours on oral diuretic regimen-if stable-discharge on 6/1.  Antimicrobial agents: Anti-infectives (From admission, onward)   None       Time spent: 25-minutes-Greater than 50% of this time was spent in counseling, explanation of diagnosis, planning of further management, and coordination of care.  MEDICATIONS: Scheduled Meds: . aspirin EC  81 mg Oral Daily  . carvedilol  6.25 mg Oral BID WC  . empagliflozin  10 mg Oral Daily  . enoxaparin (LOVENOX) injection  40 mg Subcutaneous Q24H  . furosemide  40 mg Oral Daily  . insulin aspart  0-5 Units Subcutaneous QHS  . insulin aspart  0-9 Units Subcutaneous TID WC  . potassium chloride  40 mEq Oral Q6H  . sacubitril-valsartan  1 tablet Oral BID  . sodium chloride flush  3 mL Intravenous Q12H  . spironolactone  12.5 mg Oral Daily   Continuous Infusions: . sodium chloride  PRN Meds:.sodium chloride, acetaminophen, guaiFENesin-dextromethorphan, sodium chloride flush, traZODone   PHYSICAL EXAM: Vital signs: Vitals:   04/17/21 0200 04/17/21 0459 04/17/21 0901 04/17/21 1131  BP:  94/64 103/76 (!) 88/66  Pulse:  79 87 84  Resp:  17 18 17   Temp:  97.7 F (36.5 C) (!) 97.5 F (36.4 C) 98 F (36.7 C)  TempSrc:  Oral Oral   SpO2:  95% 99% 98%  Weight: 80.8 kg     Height:       Filed Weights   04/15/21 2202 04/16/21 0401 04/17/21 0200  Weight: 82.5 kg  82.1 kg 80.8 kg   Body mass index is 27.9 kg/m.   Gen Exam:Alert awake-not in any distress HEENT:atraumatic, normocephalic Chest: B/L clear to auscultation anteriorly CVS:S1S2 regular Abdomen:soft non tender, non distended Extremities:no edema Neurology: Non focal Skin: no rash  I have personally reviewed following labs and imaging studies  LABORATORY DATA: CBC: Recent Labs  Lab 04/15/21 1426 04/15/21 2327  WBC 9.4 8.7  NEUTROABS 6.4  --   HGB 13.4 13.8  HCT 40.4 40.9  MCV 89.6 89.7  PLT 260 255    Basic Metabolic Panel: Recent Labs  Lab 04/15/21 1426 04/15/21 1641 04/15/21 2327 04/16/21 0217 04/17/21 0352  NA 137  --   --  136 133*  K 3.1*  --   --  4.1 3.4*  CL 103  --   --  104 100  CO2 24  --   --  24 23  GLUCOSE 204*  --   --  156* 270*  BUN 9  --   --  12 20  CREATININE 0.95  --  1.00 0.96 1.26*  CALCIUM 8.5*  --   --  9.2 8.8*  MG  --  2.1  --   --   --     GFR: Estimated Creatinine Clearance: 61.9 mL/min (A) (by C-G formula based on SCr of 1.26 mg/dL (H)).  Liver Function Tests: Recent Labs  Lab 04/15/21 1641  AST 26  ALT 22  ALKPHOS 108  BILITOT 0.9  PROT 7.2  ALBUMIN 3.4*   No results for input(s): LIPASE, AMYLASE in the last 168 hours. No results for input(s): AMMONIA in the last 168 hours.  Coagulation Profile: No results for input(s): INR, PROTIME in the last 168 hours.  Cardiac Enzymes: No results for input(s): CKTOTAL, CKMB, CKMBINDEX, TROPONINI in the last 168 hours.  BNP (last 3 results) No results for input(s): PROBNP in the last 8760 hours.  Lipid Profile: Recent Labs    04/17/21 0352  CHOL 206*  HDL 33*  LDLCALC 121*  TRIG 259*  CHOLHDL 6.2    Thyroid Function Tests: No results for input(s): TSH, T4TOTAL, FREET4, T3FREE, THYROIDAB in the last 72 hours.  Anemia Panel: No results for input(s): VITAMINB12, FOLATE, FERRITIN, TIBC, IRON, RETICCTPCT in the last 72 hours.  Urine analysis:    Component Value  Date/Time   COLORURINE YELLOW (A) 05/08/2019 1834   APPEARANCEUR CLEAR (A) 05/08/2019 1834   LABSPEC 1.008 05/08/2019 1834   PHURINE 7.0 05/08/2019 1834   GLUCOSEU 50 (A) 05/08/2019 1834   HGBUR NEGATIVE 05/08/2019 1834   BILIRUBINUR NEGATIVE 05/08/2019 1834   KETONESUR 5 (A) 05/08/2019 1834   PROTEINUR NEGATIVE 05/08/2019 1834   NITRITE NEGATIVE 05/08/2019 1834   LEUKOCYTESUR NEGATIVE 05/08/2019 1834    Sepsis Labs: Lactic Acid, Venous No results found for: LATICACIDVEN  MICROBIOLOGY: Recent Results (from the past 240 hour(s))  Resp Panel by  RT-PCR (Flu A&B, Covid) Nasopharyngeal Swab     Status: None   Collection Time: 04/15/21  5:01 PM   Specimen: Nasopharyngeal Swab; Nasopharyngeal(NP) swabs in vial transport medium  Result Value Ref Range Status   SARS Coronavirus 2 by RT PCR NEGATIVE NEGATIVE Final    Comment: (NOTE) SARS-CoV-2 target nucleic acids are NOT DETECTED.  The SARS-CoV-2 RNA is generally detectable in upper respiratory specimens during the acute phase of infection. The lowest concentration of SARS-CoV-2 viral copies this assay can detect is 138 copies/mL. A negative result does not preclude SARS-Cov-2 infection and should not be used as the sole basis for treatment or other patient management decisions. A negative result may occur with  improper specimen collection/handling, submission of specimen other than nasopharyngeal swab, presence of viral mutation(s) within the areas targeted by this assay, and inadequate number of viral copies(<138 copies/mL). A negative result must be combined with clinical observations, patient history, and epidemiological information. The expected result is Negative.  Fact Sheet for Patients:  BloggerCourse.com  Fact Sheet for Healthcare Providers:  SeriousBroker.it  This test is no t yet approved or cleared by the [place] FDA and  has been authorized for detection  and/or diagnosis of SARS-CoV-2 by FDA under an Emergency Use Authorization (EUA). This EUA will remain  in effect (meaning this test can be used) for the duration of the COVID-19 declaration under Section 564(b)(1) of the Act, 21 U.S.C.section 360bbb-3(b)(1), unless the authorization is terminated  or revoked sooner.       Influenza A by PCR NEGATIVE NEGATIVE Final   Influenza B by PCR NEGATIVE NEGATIVE Final    Comment: (NOTE) The Xpert Xpress SARS-CoV-2/FLU/RSV plus assay is intended as an aid in the diagnosis of influenza from Nasopharyngeal swab specimens and should not be used as a sole basis for treatment. Nasal washings and aspirates are unacceptable for Xpert Xpress SARS-CoV-2/FLU/RSV testing.  Fact Sheet for Patients: BloggerCourse.com  Fact Sheet for Healthcare Providers: SeriousBroker.it  This test is not yet approved or cleared by the [place] FDA and has been authorized for detection and/or diagnosis of SARS-CoV-2 by FDA under an Emergency Use Authorization (EUA). This EUA will remain in effect (meaning this test can be used) for the duration of the COVID-19 declaration under Section 564(b)(1) of the Act, 21 U.S.C. section 360bbb-3(b)(1), unless the authorization is terminated or revoked.  Performed at [facility], [address], [place], [place] [address]     RADIOLOGY STUDIES/RESULTS: DG Chest 2 View  Result Date: 04/15/2021 CLINICAL DATA:  Shortness of breath EXAM: CHEST - 2 VIEW COMPARISON:  11/16/2020 FINDINGS: Cardiac shadow is stable. Lungs are well aerated bilaterally. No focal infiltrate or effusion is seen. No acute bony abnormality is noted. Degenerative changes of the thoracic spine are seen. IMPRESSION: No acute abnormality noted. Electronically Signed   By: Alcide Clever M.D.   On: 04/15/2021 15:21   ECHOCARDIOGRAM COMPLETE  Result Date: 04/16/2021    ECHOCARDIOGRAM REPORT   Patient  Name:   Micheal Hardin Date of Exam: 04/16/2021 Medical Rec #:  [id number]         Height:       67.0 in Accession #:    [id number]        Weight:       181.1 lb Date of Birth:  04-14-1961         BSA:          1.939 m Patient Age:    22  years          BP:           114/85 mmHg Patient Gender: M                 HR:           85 bpm. Exam Location:  Inpatient Procedure: 2D Echo, Cardiac Doppler and Color Doppler Indications:    CHF  History:        Patient has prior history of Echocardiogram examinations, most                 recent 05/09/2019. CHF, LE edema, Signs/Symptoms:Shortness of                 Breath; Risk Factors:Diabetes and Hypertension.  Sonographer:    Lavenia Atlas Referring Phys: [id number] BRADLEY S CHOTINER IMPRESSIONS  1. There is mild sparing of contractile performance along the lateral wall. Left ventricular ejection fraction, by estimation, is 25 to 30%. The left ventricle has severely decreased function. The left ventricle demonstrates global hypokinesis. There is  mild left ventricular hypertrophy. Left ventricular diastolic parameters are consistent with Grade I diastolic dysfunction (impaired relaxation).  2. Right ventricular systolic function is normal. The right ventricular size is normal. There is normal pulmonary artery systolic pressure.  3. The mitral valve is normal in structure. Moderate mitral valve regurgitation. No evidence of mitral stenosis. Moderate mitral annular calcification.  4. The aortic valve is normal in structure. Aortic valve regurgitation is not visualized. No aortic stenosis is present.  5. The inferior vena cava is normal in size with greater than 50% respiratory variability, suggesting right atrial pressure of 3 mmHg. Comparison(s): No significant change from prior study. Prior images reviewed side by side. FINDINGS  Left Ventricle: There is mild sparing of contractile performance along the lateral wall. Left ventricular ejection fraction, by estimation, is 25  to 30%. The left ventricle has severely decreased function. The left ventricle demonstrates global hypokinesis. The left ventricular internal cavity size was normal in size. There is mild left ventricular hypertrophy. Left ventricular diastolic parameters are consistent with Grade I diastolic dysfunction (impaired relaxation). Right Ventricle: The right ventricular size is normal. No increase in right ventricular wall thickness. Right ventricular systolic function is normal. There is normal pulmonary artery systolic pressure. The tricuspid regurgitant velocity is 2.16 m/s, and  with an assumed right atrial pressure of 3 mmHg, the estimated right ventricular systolic pressure is 21.7 mmHg. Left Atrium: Left atrial size was normal in size. Right Atrium: Right atrial size was normal in size. Pericardium: There is no evidence of pericardial effusion. Mitral Valve: The mitral valve is normal in structure. There is mild thickening of the mitral valve leaflet(s). There is mild calcification of the mitral valve leaflet(s). Moderate mitral annular calcification. Moderate mitral valve regurgitation. No evidence of mitral valve stenosis. Tricuspid Valve: The tricuspid valve is normal in structure. Tricuspid valve regurgitation is not demonstrated. No evidence of tricuspid stenosis. Aortic Valve: The aortic valve is normal in structure. Aortic valve regurgitation is not visualized. No aortic stenosis is present. Pulmonic Valve: The pulmonic valve was normal in structure. Pulmonic valve regurgitation is not visualized. No evidence of pulmonic stenosis. Aorta: The aortic root is normal in size and structure. Venous: The inferior vena cava is normal in size with greater than 50% respiratory variability, suggesting right atrial pressure of 3 mmHg. IAS/Shunts: No atrial level shunt detected by color flow Doppler.  LEFT VENTRICLE PLAX 2D LVIDd:  5.20 cm  Diastology LVIDs:         4.60 cm  LV e' medial:    4.90 cm/s LV PW:          1.30 cm  LV E/e' medial:  13.0 LV IVS:        1.10 cm  LV e' lateral:   4.24 cm/s LVOT diam:     2.10 cm  LV E/e' lateral: 15.0 LV SV:         29 LV SV Index:   15 LVOT Area:     3.46 cm  RIGHT VENTRICLE RV Basal diam:  2.50 cm RV S prime:     8.92 cm/s TAPSE (M-mode): 1.7 cm LEFT ATRIUM             Index       RIGHT ATRIUM          Index LA diam:        3.80 cm 1.96 cm/m  RA Area:     8.72 cm LA Vol (A2C):   46.4 ml 23.93 ml/m RA Volume:   16.00 ml 8.25 ml/m LA Vol (A4C):   42.4 ml 21.87 ml/m LA Biplane Vol: 46.4 ml 23.93 ml/m  AORTIC VALVE LVOT Vmax:   58.10 cm/s LVOT Vmean:  32.700 cm/s LVOT VTI:    0.082 m  AORTA Ao Root diam: 3.20 cm MITRAL VALVE               TRICUSPID VALVE MV Area (PHT): 10.25 cm   TR Peak grad:   18.7 mmHg MV Decel Time: 74 msec     TR Vmax:        216.00 cm/s MV E velocity: 63.80 cm/s MV A velocity: 96.80 cm/s  SHUNTS MV E/A ratio:  0.66        Systemic VTI:  0.08 m                            Systemic Diam: 2.10 cm Donato Schultz MD Electronically signed by Donato Schultz MD Signature Date/Time: 04/16/2021/4:16:12 PM    Final    VAS [place] LOWER EXTREMITY VENOUS (DVT) (ONLY [facility] & [facility] 7a-7p)  Result Date: 04/16/2021  Lower Venous DVT Study Patient Name:  Micheal Hardin  Date of Exam:   04/15/2021 Medical Rec #: [id number]          Accession #:    [id number] Date of Birth: 1961/01/25          Patient Gender: M Patient Age:   60Y Exam Location:  [facility] Procedure:      VAS [place] LOWER EXTREMITY VENOUS (DVT) Referring Phys: [id number] Earnstine Regal HAMMOND --------------------------------------------------------------------------------  Indications: Pain.  Comparison Study: No previous exams Performing Technologist: Ernestene Mention  Examination Guidelines: A complete evaluation includes B-mode imaging, spectral Doppler, color Doppler, and power Doppler as needed of all accessible portions of each vessel. Bilateral testing is considered an integral part of a complete examination. Limited  examinations for reoccurring indications may be performed as noted. The reflux portion of the exam is performed with the patient in reverse Trendelenburg.  +---------+---------------+---------+-----------+----------+--------------+ RIGHT    CompressibilityPhasicitySpontaneityPropertiesThrombus Aging +---------+---------------+---------+-----------+----------+--------------+ CFV      Full           Yes      Yes                                 +---------+---------------+---------+-----------+----------+--------------+  SFJ      Full                                                        +---------+---------------+---------+-----------+----------+--------------+ FV Prox  Full           Yes      Yes                                 +---------+---------------+---------+-----------+----------+--------------+ FV Mid   Full           Yes      Yes                                 +---------+---------------+---------+-----------+----------+--------------+ FV DistalFull           Yes      Yes                                 +---------+---------------+---------+-----------+----------+--------------+ PFV      Full                                                        +---------+---------------+---------+-----------+----------+--------------+ POP      Full           Yes      Yes                                 +---------+---------------+---------+-----------+----------+--------------+ PTV      Full                                                        +---------+---------------+---------+-----------+----------+--------------+ PERO     Full                                                        +---------+---------------+---------+-----------+----------+--------------+   +----+---------------+---------+-----------+----------+--------------+ LEFTCompressibilityPhasicitySpontaneityPropertiesThrombus Aging  +----+---------------+---------+-----------+----------+--------------+ CFV Full           Yes      Yes                                 +----+---------------+---------+-----------+----------+--------------+     Summary: RIGHT: - There is no evidence of deep vein thrombosis in the lower extremity. - There is no evidence of superficial venous thrombosis.  - No cystic structure found in the popliteal fossa.  LEFT: - No evidence of common femoral vein obstruction.  *See table(s) above for measurements and observations. Electronically signed by Sherald Hess MD on 04/16/2021 at 9:45:05 AM.    Final  LOS: 2 days   Jeoffrey MassedShanker Marleni Gallardo, MD  [facility]    To contact the attending provider between 7A-7P or the covering provider during after hours 7P-7A, please log into the web site [email or web address] and access using universal [place] password for that web site. If you do not have the password, please call the hospital operator.  04/17/2021, 12:04 PM

## 2021-04-17 NOTE — Progress Notes (Signed)
   04/17/21 1311  Mobility  Activity Refused mobility (Declined for unspecified reasons)

## 2021-04-17 NOTE — Progress Notes (Signed)
Inpatient Diabetes Program Recommendations  AACE/ADA: New Consensus Statement on Inpatient Glycemic Control (2015)  Target Ranges:  Prepandial:   less than 140 mg/dL      Peak postprandial:   less than 180 mg/dL (1-2 hours)      Critically ill patients:  140 - 180 mg/dL   Lab Results  Component Value Date   GLUCAP 220 (H) 04/17/2021   HGBA1C 8.1 (H) 04/15/2021    Review of Glycemic Control Results for Micheal Hardin, Micheal Hardin (MRN [id number]) as of 04/17/2021 12:57  Ref. Range 04/16/2021 11:02 04/16/2021 15:26 04/16/2021 20:42 04/17/2021 06:00 04/17/2021 11:31  Glucose-Capillary Latest Ref Range: 70 - 99 mg/dL 149 (H) 702 (H) 637 (H) 199 (H) 220 (H)   Diabetes history: DM 2 Outpatient Diabetes medications:  None listed however patient states he has been on insulin and metformin previously Current orders for Inpatient glycemic control:  Novolog sensitive tid with meals and HS Jardiance 10 mg daily  Inpatient Diabetes Program Recommendations:    Referral received.  Discussed DM management with patient including that he has been started on SGLT-2 while in the hospital.  He has been on several different medications for DM in the past including insulin.  Discussed how SGLT-2 works and the importance of drinking water and also to report symptoms of acidosis to MD.  Patients son has Type 1 DM.  He is familiar with monitoring and blood sugar goals.  No further questions at this time.   Thanks  Beryl Meager, RN, BC-ADM Inpatient Diabetes Coordinator Pager [phone] (8a-5p)

## 2021-04-18 ENCOUNTER — Other Ambulatory Visit (HOSPITAL_COMMUNITY): Payer: Self-pay

## 2021-04-18 DIAGNOSIS — F1290 Cannabis use, unspecified, uncomplicated: Secondary | ICD-10-CM | POA: Diagnosis not present

## 2021-04-18 DIAGNOSIS — Z9119 Patient's noncompliance with other medical treatment and regimen: Secondary | ICD-10-CM | POA: Diagnosis not present

## 2021-04-18 DIAGNOSIS — I5023 Acute on chronic systolic (congestive) heart failure: Secondary | ICD-10-CM | POA: Diagnosis not present

## 2021-04-18 DIAGNOSIS — R072 Precordial pain: Secondary | ICD-10-CM | POA: Diagnosis not present

## 2021-04-18 DIAGNOSIS — R0602 Shortness of breath: Secondary | ICD-10-CM | POA: Diagnosis present

## 2021-04-18 DIAGNOSIS — Z20822 Contact with and (suspected) exposure to covid-19: Secondary | ICD-10-CM | POA: Diagnosis not present

## 2021-04-18 DIAGNOSIS — I110 Hypertensive heart disease with heart failure: Secondary | ICD-10-CM | POA: Diagnosis not present

## 2021-04-18 DIAGNOSIS — E785 Hyperlipidemia, unspecified: Secondary | ICD-10-CM | POA: Diagnosis not present

## 2021-04-18 DIAGNOSIS — N179 Acute kidney failure, unspecified: Secondary | ICD-10-CM | POA: Diagnosis not present

## 2021-04-18 DIAGNOSIS — E1165 Type 2 diabetes mellitus with hyperglycemia: Secondary | ICD-10-CM | POA: Diagnosis not present

## 2021-04-18 DIAGNOSIS — E871 Hypo-osmolality and hyponatremia: Secondary | ICD-10-CM | POA: Diagnosis not present

## 2021-04-18 DIAGNOSIS — R079 Chest pain, unspecified: Secondary | ICD-10-CM | POA: Diagnosis not present

## 2021-04-18 DIAGNOSIS — Z9081 Acquired absence of spleen: Secondary | ICD-10-CM | POA: Diagnosis not present

## 2021-04-18 DIAGNOSIS — I420 Dilated cardiomyopathy: Secondary | ICD-10-CM | POA: Diagnosis not present

## 2021-04-18 DIAGNOSIS — I340 Nonrheumatic mitral (valve) insufficiency: Secondary | ICD-10-CM | POA: Diagnosis not present

## 2021-04-18 DIAGNOSIS — Z79899 Other long term (current) drug therapy: Secondary | ICD-10-CM | POA: Diagnosis not present

## 2021-04-18 DIAGNOSIS — E876 Hypokalemia: Secondary | ICD-10-CM | POA: Diagnosis not present

## 2021-04-18 DIAGNOSIS — M6281 Muscle weakness (generalized): Secondary | ICD-10-CM | POA: Diagnosis not present

## 2021-04-18 LAB — BASIC METABOLIC PANEL
Anion gap: 11 (ref 5–15)
BUN: 22 mg/dL (ref 8–23)
CO2: 21 mmol/L — ABNORMAL LOW (ref 22–32)
Calcium: 9.1 mg/dL (ref 8.9–10.3)
Chloride: 102 mmol/L (ref 98–111)
Creatinine, Ser: 1.25 mg/dL — ABNORMAL HIGH (ref 0.61–1.24)
GFR, Estimated: >60 mL/min (ref >60)
Glucose, Bld: 182 mg/dL — ABNORMAL HIGH (ref 70–99)
Potassium: 4.6 mmol/L (ref 3.5–5.1)
Sodium: 134 mmol/L — ABNORMAL LOW (ref 135–145)

## 2021-04-18 LAB — GLUCOSE, CAPILLARY: Glucose-Capillary: 170 mg/dL — ABNORMAL HIGH (ref 70–99)

## 2021-04-18 MED ORDER — EMPAGLIFLOZIN 10 MG PO TABS
10.0000 mg | ORAL_TABLET | Freq: Every day | ORAL | 0 refills | Status: DC
  Filled 2021-04-18: qty 30, 30d supply, fill #0

## 2021-04-18 MED ORDER — CARVEDILOL 6.25 MG PO TABS
6.2500 mg | ORAL_TABLET | Freq: Two times a day (BID) | ORAL | 0 refills | Status: DC
  Filled 2021-04-18: qty 60, 30d supply, fill #0

## 2021-04-18 MED ORDER — METFORMIN HCL 500 MG PO TABS
500.0000 mg | ORAL_TABLET | Freq: Two times a day (BID) | ORAL | 0 refills | Status: DC
  Filled 2021-04-18: qty 60, 30d supply, fill #0

## 2021-04-18 MED ORDER — ATORVASTATIN CALCIUM 40 MG PO TABS
40.0000 mg | ORAL_TABLET | Freq: Every day | ORAL | 0 refills | Status: DC
  Filled 2021-04-18: qty 30, 30d supply, fill #0

## 2021-04-18 MED ORDER — SPIRONOLACTONE 25 MG PO TABS
12.5000 mg | ORAL_TABLET | Freq: Every day | ORAL | 0 refills | Status: DC
  Filled 2021-04-18: qty 30, 60d supply, fill #0

## 2021-04-18 MED ORDER — ASPIRIN 81 MG PO TBEC
81.0000 mg | DELAYED_RELEASE_TABLET | Freq: Every day | ORAL | 0 refills | Status: AC
  Filled 2021-04-18: qty 30, 30d supply, fill #0

## 2021-04-18 MED ORDER — FUROSEMIDE 40 MG PO TABS
40.0000 mg | ORAL_TABLET | Freq: Every morning | ORAL | 0 refills | Status: DC
  Filled 2021-04-18: qty 30, 30d supply, fill #0

## 2021-04-18 MED ORDER — SACUBITRIL-VALSARTAN 24-26 MG PO TABS
1.0000 | ORAL_TABLET | Freq: Two times a day (BID) | ORAL | 0 refills | Status: DC
  Filled 2021-04-18: qty 60, 30d supply, fill #0

## 2021-04-18 NOTE — Plan of Care (Signed)
  Problem: Activity: Goal: Capacity to carry out activities will improve Outcome: Adequate for Discharge   Problem: Cardiac: Goal: Ability to achieve and maintain adequate cardiopulmonary perfusion will improve Outcome: Adequate for Discharge   Problem: Education: Goal: Knowledge of General Education information will improve Description: Including pain rating scale, medication(s)/side effects and non-pharmacologic comfort measures Outcome: Adequate for Discharge

## 2021-04-18 NOTE — Discharge Summary (Signed)
PATIENT DETAILS Name: Micheal Hardin Age: 60 y.o. Sex: male Date of Birth: 08/30/1961 MRN: [id number]. Admitting Physician: Carlton Adam, MD WUJ:[id number], No Pcp Per (Inactive)  Admit Date: 04/15/2021 Discharge date: 04/18/2021  Recommendations for Outpatient Follow-up:  1. Follow up with PCP in 1-2 weeks 2. Please obtain CMP/CBC in one week 3. Ensure follow-up at the CHF clinic on 6/8   Admitted From:  Home  Disposition: Home   Home Health: No  Equipment/Devices: None  Discharge Condition: Stable  CODE STATUS: FULL CODE  Diet recommendation:  Diet Order            Diet - low sodium heart healthy           Diet Carb Modified           Diet heart healthy/carb modified Room service appropriate? Yes; Fluid consistency: Thin  Diet effective now                  Brief Narrative: Patient is a 60 y.o. male with history of HFrEF, DM-2, HTN-presented with chest pain/shortness of breath-found to have HFrEF with exacerbation and admitted to the hospitalist service.  See below for further details.  Significant events: 5/29>> admit for acute on chronic systolic heart failure  Significant studies: 5/29>> CXR: No pneumonia 5/29>> right lower extremity Doppler: Negative for DVT. 5/30>> TTE: EF 25-30%, global hypokinesis, grade 1 diastolic dysfunction  Antimicrobial therapy: None  Microbiology data: 5/29>> COVID/influenza PCR: Negative  Procedures : None  Consults: None  Brief Hospital Course: HFrEF with exacerbation: Volume status has stabilized-he is now euvolemic.  He was initially on IV Lasix.  He has been transitioned to oral Lasix, Aldactone.  He remains on Entresto/Coreg.  Prior MD spoke with cardiology-Dr. Raymondo Band will arrange for outpatient follow-up.    Follow-up appointment with CHF clinic also made for 6/8-where further optimization of his medications can be done.  Atypical chest pain: Resolved-probably related to above.   Continue aspirin/beta-blocker-we will add statin.  Per prior notes-patient had a negative LHC in 2021.  AKI: Mild-hemodynamically mediated/diuretics-stable for follow up.  Hypokalemia: Repleted.  Hyponatremia: Mild-should improve with ongoing diuretic regimen  HLD:  Continue statin on discharge.  DM-2 (A1c 8.1 on 5/29): CBGs relatively stable on SSI-started on Jardiance this admission.  We will add metformin on discharge. Stable for continued follow-up in the outpatient setting  Discharge Diagnoses:  Principal Problem:   Acute on chronic systolic CHF (congestive heart failure) (HCC) Active Problems:   Chest pain   Diabetes mellitus type 2, uncontrolled (HCC)   Hypokalemia   Discharge Instructions:  Activity:  As tolerated   Discharge Instructions    (HEART FAILURE PATIENTS) Call MD:  Anytime you have any of the following symptoms: 1) 3 pound weight gain in 24 hours or 5 pounds in 1 week 2) shortness of breath, with or without a dry hacking cough 3) swelling in the hands, feet or stomach 4) if you have to sleep on extra pillows at night in order to breathe.   Complete by: As directed    Call MD for:  extreme fatigue   Complete by: As directed    Diet - low sodium heart healthy   Complete by: As directed    Diet Carb Modified   Complete by: As directed    Discharge instructions   Complete by: As directed    Follow with Primary MD in 1-2 weeks  Please keep your appointment with the heart failure clinic on 6/8  Follow with cardiologist Dr. Radonna Ricker office will call you with a follow-up appointment.  Please get a complete blood count and chemistry panel checked by your Primary MD at your next visit, and again as instructed by your Primary MD.  Get Medicines reviewed and adjusted: Please take all your medications with you for your next visit with your Primary MD  Laboratory/radiological data: Please request your Primary MD to go over all hospital tests and  procedure/radiological results at the follow up, please ask your Primary MD to get all Hospital records sent to his/her office.  In some cases, they will be blood work, cultures and biopsy results pending at the time of your discharge. Please request that your primary care M.D. follows up on these results.  Also Note the following: If you experience worsening of your admission symptoms, develop shortness of breath, life threatening emergency, suicidal or homicidal thoughts you must seek medical attention immediately by calling 911 or calling your MD immediately  if symptoms less severe.  You must read complete instructions/literature along with all the possible adverse reactions/side effects for all the Medicines you take and that have been prescribed to you. Take any new Medicines after you have completely understood and accpet all the possible adverse reactions/side effects.   Do not drive when taking Pain medications or sleeping medications (Benzodaizepines)  Do not take more than prescribed Pain, Sleep and Anxiety Medications. It is not advisable to combine anxiety,sleep and pain medications without talking with your primary care practitioner  Special Instructions: If you have smoked or chewed Tobacco  in the last 2 yrs please stop smoking, stop any regular Alcohol  and or any Recreational drug use.  Wear Seat belts while driving.  Please note: You were cared for by a hospitalist during your hospital stay. Once you are discharged, your primary care physician will handle any further medical issues. Please note that NO REFILLS for any discharge medications will be authorized once you are discharged, as it is imperative that you return to your primary care physician (or establish a relationship with a primary care physician if you do not have one) for your post hospital discharge needs so that they can reassess your need for medications and monitor your lab values.   Heart Failure patients record  your daily weight using the same scale at the same time of day   Complete by: As directed    Increase activity slowly   Complete by: As directed    STOP any activity that causes chest pain, shortness of breath, dizziness, sweating, or exessive weakness   Complete by: As directed      Allergies as of 04/18/2021   No Known Allergies     Medication List    STOP taking these medications   ibuprofen 200 MG tablet Commonly known as: ADVIL     TAKE these medications   aspirin 81 MG EC tablet Take 1 tablet (81 mg total) by mouth daily. Swallow whole.   atorvastatin 40 MG tablet Commonly known as: LIPITOR Take 1 tablet (40 mg total) by mouth daily.   carvedilol 6.25 MG tablet Commonly known as: COREG Take 1 tablet (6.25 mg total) by mouth 2 (two) times daily with a meal.   empagliflozin 10 MG Tabs tablet Commonly known as: JARDIANCE Take 1 tablet (10 mg total) by mouth daily.   furosemide 40 MG tablet Commonly known as: LASIX Take 1 tablet (40 mg total) by mouth every morning.   metFORMIN 500 MG tablet Commonly  known as: Glucophage Take 1 tablet (500 mg total) by mouth 2 (two) times daily with a meal.   sacubitril-valsartan 24-26 MG Commonly known as: ENTRESTO Take 1 tablet by mouth 2 (two) times daily.   spironolactone 25 MG tablet Commonly known as: ALDACTONE Take 0.5 tablets (12.5 mg total) by mouth daily.       Follow-up Information    Rinaldo CloudHarwani, Mohan, MD. Schedule an appointment as soon as possible for a visit.   Specialty: Cardiology Why: Call for cardiology appointment.  Patient was discussed with Dr. Sharyn LullHarwani, will see in close clinic follow up. Contact information: [address] [place] [id number] [phone]        [facility] Follow up on 04/25/2021.   Specialty: Cardiology Why: APPOINTMENT AT 2 PM Contact information: [address] [id number] [place]  [id number] [phone]             No Known Allergies  Consultations: nONE  Other Procedures/Studies: DG Chest 2 View  Result Date: 04/15/2021 CLINICAL DATA:  Shortness of breath EXAM: CHEST - 2 VIEW COMPARISON:  11/16/2020 FINDINGS: Cardiac shadow is stable. Lungs are well aerated bilaterally. No focal infiltrate or effusion is seen. No acute bony abnormality is noted. Degenerative changes of the thoracic spine are seen. IMPRESSION: No acute abnormality noted. Electronically Signed   By: Alcide CleverMark  Lukens M.D.   On: 04/15/2021 15:21   ECHOCARDIOGRAM COMPLETE  Result Date: 04/16/2021    ECHOCARDIOGRAM REPORT   Patient Name:   Randalyn RheaCHRISTOPHER Bauza Date of Exam: 04/16/2021 Medical Rec #:  [id number]         Height:       67.0 in Accession #:    [id number]        Weight:       181.1 lb Date of Birth:  06-24-1961         BSA:          1.939 m Patient Age:    60 years          BP:           114/85 mmHg Patient Gender: M                 HR:           85 bpm. Exam Location:  Inpatient Procedure: 2D Echo, Cardiac Doppler and Color Doppler Indications:    CHF  History:        Patient has prior history of Echocardiogram examinations, most                 recent 05/09/2019. CHF, LE edema, Signs/Symptoms:Shortness of                 Breath; Risk Factors:Diabetes and Hypertension.  Sonographer:    Lavenia AtlasBrooke Strickland Referring Phys: [id number] BRADLEY S CHOTINER IMPRESSIONS  1. There is mild sparing of contractile performance along the lateral wall. Left ventricular ejection fraction, by estimation, is 25 to 30%. The left ventricle has severely decreased function. The left ventricle demonstrates global hypokinesis. There is  mild left ventricular hypertrophy. Left ventricular diastolic parameters are consistent with Grade I diastolic dysfunction (impaired relaxation).  2. Right ventricular systolic function is normal. The right ventricular size is normal. There is normal pulmonary artery systolic pressure.  3. The mitral valve  is normal in structure. Moderate mitral valve regurgitation. No evidence of mitral stenosis. Moderate mitral annular calcification.  4. The aortic valve  is normal in structure. Aortic valve regurgitation is not visualized. No aortic stenosis is present.  5. The inferior vena cava is normal in size with greater than 50% respiratory variability, suggesting right atrial pressure of 3 mmHg. Comparison(s): No significant change from prior study. Prior images reviewed side by side. FINDINGS  Left Ventricle: There is mild sparing of contractile performance along the lateral wall. Left ventricular ejection fraction, by estimation, is 25 to 30%. The left ventricle has severely decreased function. The left ventricle demonstrates global hypokinesis. The left ventricular internal cavity size was normal in size. There is mild left ventricular hypertrophy. Left ventricular diastolic parameters are consistent with Grade I diastolic dysfunction (impaired relaxation). Right Ventricle: The right ventricular size is normal. No increase in right ventricular wall thickness. Right ventricular systolic function is normal. There is normal pulmonary artery systolic pressure. The tricuspid regurgitant velocity is 2.16 m/s, and  with an assumed right atrial pressure of 3 mmHg, the estimated right ventricular systolic pressure is 21.7 mmHg. Left Atrium: Left atrial size was normal in size. Right Atrium: Right atrial size was normal in size. Pericardium: There is no evidence of pericardial effusion. Mitral Valve: The mitral valve is normal in structure. There is mild thickening of the mitral valve leaflet(s). There is mild calcification of the mitral valve leaflet(s). Moderate mitral annular calcification. Moderate mitral valve regurgitation. No evidence of mitral valve stenosis. Tricuspid Valve: The tricuspid valve is normal in structure. Tricuspid valve regurgitation is not demonstrated. No evidence of tricuspid stenosis. Aortic Valve: The  aortic valve is normal in structure. Aortic valve regurgitation is not visualized. No aortic stenosis is present. Pulmonic Valve: The pulmonic valve was normal in structure. Pulmonic valve regurgitation is not visualized. No evidence of pulmonic stenosis. Aorta: The aortic root is normal in size and structure. Venous: The inferior vena cava is normal in size with greater than 50% respiratory variability, suggesting right atrial pressure of 3 mmHg. IAS/Shunts: No atrial level shunt detected by color flow Doppler.  LEFT VENTRICLE PLAX 2D LVIDd:         5.20 cm  Diastology LVIDs:         4.60 cm  LV e' medial:    4.90 cm/s LV PW:         1.30 cm  LV E/e' medial:  13.0 LV IVS:        1.10 cm  LV e' lateral:   4.24 cm/s LVOT diam:     2.10 cm  LV E/e' lateral: 15.0 LV SV:         29 LV SV Index:   15 LVOT Area:     3.46 cm  RIGHT VENTRICLE RV Basal diam:  2.50 cm RV S prime:     8.92 cm/s TAPSE (M-mode): 1.7 cm LEFT ATRIUM             Index       RIGHT ATRIUM          Index LA diam:        3.80 cm 1.96 cm/m  RA Area:     8.72 cm LA Vol (A2C):   46.4 ml 23.93 ml/m RA Volume:   16.00 ml 8.25 ml/m LA Vol (A4C):   42.4 ml 21.87 ml/m LA Biplane Vol: 46.4 ml 23.93 ml/m  AORTIC VALVE LVOT Vmax:   58.10 cm/s LVOT Vmean:  32.700 cm/s LVOT VTI:    0.082 m  AORTA Ao Root diam: 3.20 cm MITRAL VALVE  TRICUSPID VALVE MV Area (PHT): 10.25 cm   TR Peak grad:   18.7 mmHg MV Decel Time: 74 msec     TR Vmax:        216.00 cm/s MV E velocity: 63.80 cm/s MV A velocity: 96.80 cm/s  SHUNTS MV E/A ratio:  0.66        Systemic VTI:  0.08 m                            Systemic Diam: 2.10 cm Donato Schultz MD Electronically signed by Donato Schultz MD Signature Date/Time: 04/16/2021/4:16:12 PM    Final    VAS [place] LOWER EXTREMITY VENOUS (DVT) (ONLY [facility] & [facility] 7a-7p)  Result Date: 04/16/2021  Lower Venous DVT Study Patient Name:  BASEL DEFALCO  Date of Exam:   04/15/2021 Medical Rec #: [id number]          Accession #:    [id number]  Date of Birth: February 07, 1961          Patient Gender: M Patient Age:   60Y Exam Location:  [facility] Procedure:      VAS [place] LOWER EXTREMITY VENOUS (DVT) Referring Phys: [id number] Earnstine Regal HAMMOND --------------------------------------------------------------------------------  Indications: Pain.  Comparison Study: No previous exams Performing Technologist: Ernestene Mention  Examination Guidelines: A complete evaluation includes B-mode imaging, spectral Doppler, color Doppler, and power Doppler as needed of all accessible portions of each vessel. Bilateral testing is considered an integral part of a complete examination. Limited examinations for reoccurring indications may be performed as noted. The reflux portion of the exam is performed with the patient in reverse Trendelenburg.  +---------+---------------+---------+-----------+----------+--------------+ RIGHT    CompressibilityPhasicitySpontaneityPropertiesThrombus Aging +---------+---------------+---------+-----------+----------+--------------+ CFV      Full           Yes      Yes                                 +---------+---------------+---------+-----------+----------+--------------+ SFJ      Full                                                        +---------+---------------+---------+-----------+----------+--------------+ FV Prox  Full           Yes      Yes                                 +---------+---------------+---------+-----------+----------+--------------+ FV Mid   Full           Yes      Yes                                 +---------+---------------+---------+-----------+----------+--------------+ FV DistalFull           Yes      Yes                                 +---------+---------------+---------+-----------+----------+--------------+ PFV      Full                                                        +---------+---------------+---------+-----------+----------+--------------+  POP      Full            Yes      Yes                                 +---------+---------------+---------+-----------+----------+--------------+ PTV      Full                                                        +---------+---------------+---------+-----------+----------+--------------+ PERO     Full                                                        +---------+---------------+---------+-----------+----------+--------------+   +----+---------------+---------+-----------+----------+--------------+ LEFTCompressibilityPhasicitySpontaneityPropertiesThrombus Aging +----+---------------+---------+-----------+----------+--------------+ CFV Full           Yes      Yes                                 +----+---------------+---------+-----------+----------+--------------+     Summary: RIGHT: - There is no evidence of deep vein thrombosis in the lower extremity. - There is no evidence of superficial venous thrombosis.  - No cystic structure found in the popliteal fossa.  LEFT: - No evidence of common femoral vein obstruction.  *See table(s) above for measurements and observations. Electronically signed by Sherald Hess MD on 04/16/2021 at 9:45:05 AM.    Final      TODAY-DAY OF DISCHARGE:  Subjective:   Randalyn Rhea today has no headache,no chest abdominal pain,no new weakness tingling or numbness, feels much better wants to go home today.   Objective:   Blood pressure 100/71, pulse 89, temperature 97.9 F (36.6 C), temperature source Oral, resp. rate 16, height 5\' 7"  (1.702 m), weight 80.7 kg, SpO2 100 %.  Intake/Output Summary (Last 24 hours) at 04/18/2021 0913 Last data filed at 04/18/2021 0657 Gross per 24 hour  Intake 920 ml  Output --  Net 920 ml   Filed Weights   04/16/21 0401 04/17/21 0200 04/18/21 0506  Weight: 82.1 kg 80.8 kg 80.7 kg    Exam: Awake Alert, Oriented *3, No new F.N deficits, Normal affect [place].AT,PERRAL Supple Neck,No JVD, No cervical lymphadenopathy  appriciated.  Symmetrical Chest wall movement, Good air movement bilaterally, CTAB RRR,No Gallops,Rubs or new Murmurs, No Parasternal Heave +ve B.Sounds, Abd Soft, Non tender, No organomegaly appriciated, No rebound -guarding or rigidity. No Cyanosis, Clubbing or edema, No new Rash or bruise   PERTINENT RADIOLOGIC STUDIES: ECHOCARDIOGRAM COMPLETE  Result Date: 04/16/2021    ECHOCARDIOGRAM REPORT   Patient Name:   BRENTLEE DELAGE Date of Exam: 04/16/2021 Medical Rec #:  [id number]         Height:       67.0 in Accession #:    [id number]        Weight:       181.1 lb Date of Birth:  08-25-1961         BSA:          1.939 m Patient Age:    29 years  BP:           114/85 mmHg Patient Gender: M                 HR:           85 bpm. Exam Location:  Inpatient Procedure: 2D Echo, Cardiac Doppler and Color Doppler Indications:    CHF  History:        Patient has prior history of Echocardiogram examinations, most                 recent 05/09/2019. CHF, LE edema, Signs/Symptoms:Shortness of                 Breath; Risk Factors:Diabetes and Hypertension.  Sonographer:    Lavenia Atlas Referring Phys: [id number] BRADLEY S CHOTINER IMPRESSIONS  1. There is mild sparing of contractile performance along the lateral wall. Left ventricular ejection fraction, by estimation, is 25 to 30%. The left ventricle has severely decreased function. The left ventricle demonstrates global hypokinesis. There is  mild left ventricular hypertrophy. Left ventricular diastolic parameters are consistent with Grade I diastolic dysfunction (impaired relaxation).  2. Right ventricular systolic function is normal. The right ventricular size is normal. There is normal pulmonary artery systolic pressure.  3. The mitral valve is normal in structure. Moderate mitral valve regurgitation. No evidence of mitral stenosis. Moderate mitral annular calcification.  4. The aortic valve is normal in structure. Aortic valve regurgitation is not  visualized. No aortic stenosis is present.  5. The inferior vena cava is normal in size with greater than 50% respiratory variability, suggesting right atrial pressure of 3 mmHg. Comparison(s): No significant change from prior study. Prior images reviewed side by side. FINDINGS  Left Ventricle: There is mild sparing of contractile performance along the lateral wall. Left ventricular ejection fraction, by estimation, is 25 to 30%. The left ventricle has severely decreased function. The left ventricle demonstrates global hypokinesis. The left ventricular internal cavity size was normal in size. There is mild left ventricular hypertrophy. Left ventricular diastolic parameters are consistent with Grade I diastolic dysfunction (impaired relaxation). Right Ventricle: The right ventricular size is normal. No increase in right ventricular wall thickness. Right ventricular systolic function is normal. There is normal pulmonary artery systolic pressure. The tricuspid regurgitant velocity is 2.16 m/s, and  with an assumed right atrial pressure of 3 mmHg, the estimated right ventricular systolic pressure is 21.7 mmHg. Left Atrium: Left atrial size was normal in size. Right Atrium: Right atrial size was normal in size. Pericardium: There is no evidence of pericardial effusion. Mitral Valve: The mitral valve is normal in structure. There is mild thickening of the mitral valve leaflet(s). There is mild calcification of the mitral valve leaflet(s). Moderate mitral annular calcification. Moderate mitral valve regurgitation. No evidence of mitral valve stenosis. Tricuspid Valve: The tricuspid valve is normal in structure. Tricuspid valve regurgitation is not demonstrated. No evidence of tricuspid stenosis. Aortic Valve: The aortic valve is normal in structure. Aortic valve regurgitation is not visualized. No aortic stenosis is present. Pulmonic Valve: The pulmonic valve was normal in structure. Pulmonic valve regurgitation is not  visualized. No evidence of pulmonic stenosis. Aorta: The aortic root is normal in size and structure. Venous: The inferior vena cava is normal in size with greater than 50% respiratory variability, suggesting right atrial pressure of 3 mmHg. IAS/Shunts: No atrial level shunt detected by color flow Doppler.  LEFT VENTRICLE PLAX 2D LVIDd:         5.20  cm  Diastology LVIDs:         4.60 cm  LV e' medial:    4.90 cm/s LV PW:         1.30 cm  LV E/e' medial:  13.0 LV IVS:        1.10 cm  LV e' lateral:   4.24 cm/s LVOT diam:     2.10 cm  LV E/e' lateral: 15.0 LV SV:         29 LV SV Index:   15 LVOT Area:     3.46 cm  RIGHT VENTRICLE RV Basal diam:  2.50 cm RV S prime:     8.92 cm/s TAPSE (M-mode): 1.7 cm LEFT ATRIUM             Index       RIGHT ATRIUM          Index LA diam:        3.80 cm 1.96 cm/m  RA Area:     8.72 cm LA Vol (A2C):   46.4 ml 23.93 ml/m RA Volume:   16.00 ml 8.25 ml/m LA Vol (A4C):   42.4 ml 21.87 ml/m LA Biplane Vol: 46.4 ml 23.93 ml/m  AORTIC VALVE LVOT Vmax:   58.10 cm/s LVOT Vmean:  32.700 cm/s LVOT VTI:    0.082 m  AORTA Ao Root diam: 3.20 cm MITRAL VALVE               TRICUSPID VALVE MV Area (PHT): 10.25 cm   TR Peak grad:   18.7 mmHg MV Decel Time: 74 msec     TR Vmax:        216.00 cm/s MV E velocity: 63.80 cm/s MV A velocity: 96.80 cm/s  SHUNTS MV E/A ratio:  0.66        Systemic VTI:  0.08 m                            Systemic Diam: 2.10 cm Donato Schultz MD Electronically signed by Donato Schultz MD Signature Date/Time: 04/16/2021/4:16:12 PM    Final      PERTINENT LAB RESULTS: CBC: Recent Labs    04/15/21 1426 04/15/21 2327  WBC 9.4 8.7  HGB 13.4 13.8  HCT 40.4 40.9  PLT 260 255   CMET CMP     Component Value Date/Time   NA 134 (L) 04/18/2021 0314   K 4.6 04/18/2021 0314   CL 102 04/18/2021 0314   CO2 21 (L) 04/18/2021 0314   GLUCOSE 182 (H) 04/18/2021 0314   BUN 22 04/18/2021 0314   CREATININE 1.25 (H) 04/18/2021 0314   CALCIUM 9.1 04/18/2021 0314   PROT 7.2  04/15/2021 1641   ALBUMIN 3.4 (L) 04/15/2021 1641   AST 26 04/15/2021 1641   ALT 22 04/15/2021 1641   ALKPHOS 108 04/15/2021 1641   BILITOT 0.9 04/15/2021 1641   GFRNONAA >60 04/18/2021 0314   GFRAA >60 05/10/2019 0601    GFR Estimated Creatinine Clearance: 62.3 mL/min (A) (by C-G formula based on SCr of 1.25 mg/dL (H)). No results for input(s): LIPASE, AMYLASE in the last 72 hours. No results for input(s): CKTOTAL, CKMB, CKMBINDEX, TROPONINI in the last 72 hours. Invalid input(s): POCBNP Recent Labs    04/15/21 2327  DDIMER 0.50   Recent Labs    04/15/21 2327  HGBA1C 8.1*   Recent Labs    04/17/21 0352  CHOL 206*  HDL 33*  LDLCALC 121*  TRIG 259*  CHOLHDL 6.2   No results for input(s): TSH, T4TOTAL, T3FREE, THYROIDAB in the last 72 hours.  Invalid input(s): FREET3 No results for input(s): VITAMINB12, FOLATE, FERRITIN, TIBC, IRON, RETICCTPCT in the last 72 hours. Coags: No results for input(s): INR in the last 72 hours.  Invalid input(s): PT Microbiology: Recent Results (from the past 240 hour(s))  Resp Panel by RT-PCR (Flu A&B, Covid) Nasopharyngeal Swab     Status: None   Collection Time: 04/15/21  5:01 PM   Specimen: Nasopharyngeal Swab; Nasopharyngeal(NP) swabs in vial transport medium  Result Value Ref Range Status   SARS Coronavirus 2 by RT PCR NEGATIVE NEGATIVE Final    Comment: (NOTE) SARS-CoV-2 target nucleic acids are NOT DETECTED.  The SARS-CoV-2 RNA is generally detectable in upper respiratory specimens during the acute phase of infection. The lowest concentration of SARS-CoV-2 viral copies this assay can detect is 138 copies/mL. A negative result does not preclude SARS-Cov-2 infection and should not be used as the sole basis for treatment or other patient management decisions. A negative result may occur with  improper specimen collection/handling, submission of specimen other than nasopharyngeal swab, presence of viral mutation(s) within  the areas targeted by this assay, and inadequate number of viral copies(<138 copies/mL). A negative result must be combined with clinical observations, patient history, and epidemiological information. The expected result is Negative.  Fact Sheet for Patients:  BloggerCourse.com  Fact Sheet for Healthcare Providers:  SeriousBroker.it  This test is no t yet approved or cleared by the [place] FDA and  has been authorized for detection and/or diagnosis of SARS-CoV-2 by FDA under an Emergency Use Authorization (EUA). This EUA will remain  in effect (meaning this test can be used) for the duration of the COVID-19 declaration under Section 564(b)(1) of the Act, 21 U.S.C.section 360bbb-3(b)(1), unless the authorization is terminated  or revoked sooner.       Influenza A by PCR NEGATIVE NEGATIVE Final   Influenza B by PCR NEGATIVE NEGATIVE Final    Comment: (NOTE) The Xpert Xpress SARS-CoV-2/FLU/RSV plus assay is intended as an aid in the diagnosis of influenza from Nasopharyngeal swab specimens and should not be used as a sole basis for treatment. Nasal washings and aspirates are unacceptable for Xpert Xpress SARS-CoV-2/FLU/RSV testing.  Fact Sheet for Patients: BloggerCourse.com  Fact Sheet for Healthcare Providers: SeriousBroker.it  This test is not yet approved or cleared by the [place] FDA and has been authorized for detection and/or diagnosis of SARS-CoV-2 by FDA under an Emergency Use Authorization (EUA). This EUA will remain in effect (meaning this test can be used) for the duration of the COVID-19 declaration under Section 564(b)(1) of the Act, 21 U.S.C. section 360bbb-3(b)(1), unless the authorization is terminated or revoked.  Performed at [facility], [address], [place], [place] [address]     FURTHER DISCHARGE INSTRUCTIONS:  Get Medicines  reviewed and adjusted: Please take all your medications with you for your next visit with your Primary MD  Laboratory/radiological data: Please request your Primary MD to go over all hospital tests and procedure/radiological results at the follow up, please ask your Primary MD to get all Hospital records sent to his/her office.  In some cases, they will be blood work, cultures and biopsy results pending at the time of your discharge. Please request that your primary care M.D. goes through all the records of your hospital data and follows up on these results.  Also Note the following: If you  experience worsening of your admission symptoms, develop shortness of breath, life threatening emergency, suicidal or homicidal thoughts you must seek medical attention immediately by calling 911 or calling your MD immediately  if symptoms less severe.  You must read complete instructions/literature along with all the possible adverse reactions/side effects for all the Medicines you take and that have been prescribed to you. Take any new Medicines after you have completely understood and accpet all the possible adverse reactions/side effects.   Do not drive when taking Pain medications or sleeping medications (Benzodaizepines)  Do not take more than prescribed Pain, Sleep and Anxiety Medications. It is not advisable to combine anxiety,sleep and pain medications without talking with your primary care practitioner  Special Instructions: If you have smoked or chewed Tobacco  in the last 2 yrs please stop smoking, stop any regular Alcohol  and or any Recreational drug use.  Wear Seat belts while driving.  Please note: You were cared for by a hospitalist during your hospital stay. Once you are discharged, your primary care physician will handle any further medical issues. Please note that NO REFILLS for any discharge medications will be authorized once you are discharged, as it is imperative that you return to  your primary care physician (or establish a relationship with a primary care physician if you do not have one) for your post hospital discharge needs so that they can reassess your need for medications and monitor your lab values.  Total Time spent coordinating discharge including counseling, education and face to face time equals 35 minutes.  SignedJeoffrey Massed 04/18/2021 9:13 AM

## 2021-04-18 NOTE — Progress Notes (Signed)
D/C instructions given and reviewed. Tele and IV removed, tolerated well. Awaiting TOC med delivery. 

## 2021-04-18 NOTE — Progress Notes (Signed)
Heart Failure Stewardship Pharmacist Progress Note   PCP: Patient, No Pcp Per (Inactive) PCP-Cardiologist: None    HPI:  60 yo male with PMH HTN, systolic CHF, and T2DM presents with SOB and substernal CP. Per MD report, he reported that he got SOB walking to the bathroom and while at rest. He was also feeling nauseous and clammy on admission. He reports that he had a cardiac cath in [facility] in November 2021 but no stents were placed. ECHO on 04/16/21 with LVEF 25-30% which is up from 20-25% in June 2020, RV function normal.  Discharge HF Medications: PO furosemide 40 mg daily Carvedilol 6.25 mg BID Entresto 24/26 mg BID Jardiance 10 mg daily Spironolactone 12.5 mg daily KCl 40 mEq q6 hrs  Prior to admission HF Medications: Carvedilol 6.25 mg BID PO furosemide 40 mg daily  Pertinent Lab Values: . Serum creatinine 1.25, BUN 22, Potassium 4.6, Sodium 134  Vital Signs: . Weight: 177 lbs (admission weight: 181.8 lbs) . Blood pressure: 100/70s  . Heart rate: 80s   Medication Assistance / Insurance Benefits Check: Does the patient have prescription insurance?  Yes Type of insurance plan: [employer] Medicare and [place] Medicaid  Outpatient Pharmacy:  Prior to admission outpatient pharmacy: [facility] Is the patient willing to use [facility] TOC pharmacy at discharge? Yes Is the patient willing to transition their outpatient pharmacy to utilize a [facility] outpatient pharmacy?   Pending    Assessment/Plan: 1) Acute on chronic systolic CHF (EF 83-38%), due to uncontrolled HTN. NYHA class II symptoms. - Continue PO furosemide 40 mg daily - Continue carvedilol 6.25 mg BID - Continue Entresto 24/26 mg BID - Continue Jardiance 10 mg daily - Continue spironolactone 12.5 mg daily  2) Patient assistance: - Entresto copay $4/month - Jardiance copay $4/month  3)  Education  - Patient has been educated on current HF medications and potential additions to HF medication regimen  - Patient  verbalizes understanding that over the next few months, these medication doses may change and more medications may be added to optimize HF regimen - Patient has been educated on basic disease state pathophysiology and goals of therapy - Time spent 30 mins - HF TOC appointment scheduled for 04/25/21  Sharen Hones, PharmD, BCPS Heart Failure Stewardship Pharmacist Phone [phone]

## 2021-04-23 NOTE — Progress Notes (Signed)
Heart [facility] Vascular Center Transitions of Care Clinic  PCP: none Primary Cardiologist: none  HPI:  Micheal Hardin is a 60 y.o.  male  with a PMH significant for HTN, HFrEF,  T2DM  Admitted on 04/24/2015 at OSH for worsening SOB, found to have severe LV systolic dysfunction and segmental wall motion abnormality by echocardiography. He underwent transradial coronary angiography on 04/25/2015, which showed no angiographically evident CAD but severe LV dysfunction with LVEF estimated 25-30% with global hypokinesis by LV gram. LVEDP was elevated 23 mmHg. He was subsequently started on medical therapy for nonischemic cardiomyopathy/CHF.  EF of 20-25% on echo from June 2020 when he was admitted to [facility] for gastroenteritis, was not in CHF exacerbation at that time  He reported that he had a cardiac catheterization in the [facility] area in November 2021 but no stents were placed  04/15/21 Admitted for worsening shortness of breath and substernal chest pain.  ED workup HS troponin 29 with repeat troponin of 25.  BNP elevated at 312.2.   BUN 9, creatinine 0.95, normal LFTs.  Admission ECHO 04/16/21 EF 25-30%, septum appears somewhat more hypokinetic than the rest of the LV, RV is normal, valves okay.  Received IV lasix 40mg  x1 then transitioned to oral lasix. Started on ,  his coreg was restarted.  Wt trend 181->178lbs.  Cr 0.95>1.25.    Tells me he was on lasix and carvedilol alone before most recent admission.  Feels better since leaving the hospital.  Still having weakness and dyspnea with activity.  No chest pain.  Does have some orthopnea.  He had some mild dyspnea before   ROS: All systems negative except as listed in HPI, PMH and Problem List.  SH:  Social History   Socioeconomic History   Marital status: Widowed    Spouse name: Not on file   Number of children: Not on file   Years of education: Not on file   Highest education level: Not on file  Occupational History    Not on file  Tobacco Use   Smoking status: Never Smoker   Smokeless tobacco: Never Used  Substance and Sexual Activity   Alcohol use: Never   Drug use: Yes    Types: Marijuana   Sexual activity: Not on file  Other Topics Concern   Not on file  Social History Narrative   Not on file   Social Determinants of Health   Financial Resource Strain: Not on file  Food Insecurity: Not on file  Transportation Needs: Not on file  Physical Activity: Not on file  Stress: Not on file  Social Connections: Not on file  Intimate Partner Violence: Not on file    FH: No family history on file.  Past Medical History:  Diagnosis Date   Arthritis    CHF (congestive heart failure) (HCC)    Diabetes mellitus without complication (HCC)    Hypertension     Current Outpatient Medications  Medication Sig Dispense Refill   aspirin 81 MG EC tablet Take 1 tablet (81 mg total) by mouth daily. Swallow whole. 30 tablet 0   atorvastatin (LIPITOR) 40 MG tablet Take 1 tablet (40 mg total) by mouth daily. 30 tablet 0   carvedilol (COREG) 6.25 MG tablet Take 1 tablet (6.25 mg total) by mouth 2 (two) times daily with a meal. 60 tablet 0   empagliflozin (JARDIANCE) 10 MG TABS tablet Take 1 tablet (10 mg total) by mouth daily. 30 tablet 0   furosemide (LASIX) 40 MG  tablet Take 1 tablet (40 mg total) by mouth every morning. 30 tablet 0   metFORMIN (GLUCOPHAGE) 500 MG tablet Take 1 tablet (500 mg total) by mouth 2 (two) times daily with a meal. 60 tablet 0   sacubitril-valsartan (ENTRESTO) 24-26 MG Take 1 tablet by mouth 2 (two) times daily. 60 tablet 0   spironolactone (ALDACTONE) 25 MG tablet Take 1/2 tablet (12.5 mg total) by mouth daily. 30 tablet 0   No current facility-administered medications for this visit.    There were no vitals filed for this visit.  PHYSICAL EXAM: Cardiac: JVD flat, normal rate and rhythm, clear s1 and s2, no murmurs, rubs or gallops, no LE edema Pulmonary: CTAB, not in  distress Abdominal: non distended abdomen, soft and nontender Psych: Alert, conversant, in good spirits    ASSESSMENT & PLAN: Chronic Systolic CHF: -NICM, coronary angiography on 04/25/2015, which showed no angiographically evident CAD but severe LV dysfunction with LVEF estimated 25-30% with global hypokinesis by LV gram. LVEDP was elevated 23 mmHg -EF of 20-25% on echo from June 2020 -ECHO 04/16/21 EF 25-30%, septum appears somewhat more hypokinetic than the rest of the LV, RV is normal, valves okay -NYHA Class III symptoms, dry on exam and REDS 29% -Currently he is on jardiance 10, spiro 12.5, entresto 24/26, carvedilol 6.25mg  BID and lasix 40mg  -bp low, dizzy volume deplete switch lasix to PRN told him I expect he won't need it, no room to titrate therapy but may have the opportunity once he becomes more euvolemic -Now that he is on all four guideline therapies as opposed to one, my hope is that he will have an improvement in EF and symptoms in a few months, if not we need to explore other causes possibly starting with cardiac MRI and will also need to consider ICD referral/cpx testing  T2DM: -continue jardiance    Follow up with me in 2 weeks then establish with AHF

## 2021-04-24 ENCOUNTER — Telehealth (HOSPITAL_COMMUNITY): Payer: Self-pay | Admitting: Licensed Clinical Social Worker

## 2021-04-24 NOTE — Telephone Encounter (Signed)
CSW contacted patient to remind of appointment in the [facility] tomorrow. Patient confirmed. Lasandra Beech, LCSW, CCSW-MCS [phone]

## 2021-04-25 ENCOUNTER — Encounter (HOSPITAL_COMMUNITY): Payer: Self-pay

## 2021-04-25 ENCOUNTER — Ambulatory Visit (HOSPITAL_COMMUNITY)
Admit: 2021-04-25 | Discharge: 2021-04-25 | Disposition: A | Discharge status: Home / Self Care | Payer: Medicare HMO | Source: Ambulatory Visit | Attending: Internal Medicine | Admitting: Internal Medicine

## 2021-04-25 ENCOUNTER — Other Ambulatory Visit: Payer: Self-pay

## 2021-04-25 ENCOUNTER — Other Ambulatory Visit (HOSPITAL_COMMUNITY): Payer: Self-pay

## 2021-04-25 VITALS — BP 102/80 | HR 103 | Wt 180.8 lb

## 2021-04-25 DIAGNOSIS — I428 Other cardiomyopathies: Secondary | ICD-10-CM | POA: Diagnosis not present

## 2021-04-25 DIAGNOSIS — Z7984 Long term (current) use of oral hypoglycemic drugs: Secondary | ICD-10-CM | POA: Diagnosis not present

## 2021-04-25 DIAGNOSIS — Z79899 Other long term (current) drug therapy: Secondary | ICD-10-CM | POA: Diagnosis not present

## 2021-04-25 DIAGNOSIS — R42 Dizziness and giddiness: Secondary | ICD-10-CM | POA: Insufficient documentation

## 2021-04-25 DIAGNOSIS — E119 Type 2 diabetes mellitus without complications: Secondary | ICD-10-CM | POA: Diagnosis not present

## 2021-04-25 DIAGNOSIS — I5022 Chronic systolic (congestive) heart failure: Secondary | ICD-10-CM | POA: Diagnosis not present

## 2021-04-25 DIAGNOSIS — R531 Weakness: Secondary | ICD-10-CM | POA: Insufficient documentation

## 2021-04-25 DIAGNOSIS — Z7901 Long term (current) use of anticoagulants: Secondary | ICD-10-CM | POA: Diagnosis not present

## 2021-04-25 DIAGNOSIS — I110 Hypertensive heart disease with heart failure: Secondary | ICD-10-CM | POA: Diagnosis not present

## 2021-04-25 DIAGNOSIS — R0602 Shortness of breath: Secondary | ICD-10-CM | POA: Insufficient documentation

## 2021-04-25 DIAGNOSIS — R0789 Other chest pain: Secondary | ICD-10-CM | POA: Diagnosis not present

## 2021-04-25 DIAGNOSIS — Z7982 Long term (current) use of aspirin: Secondary | ICD-10-CM | POA: Diagnosis not present

## 2021-04-25 LAB — BASIC METABOLIC PANEL
Anion gap: 10 (ref 5–15)
BUN: 24 mg/dL — ABNORMAL HIGH (ref 8–23)
CO2: 19 mmol/L — ABNORMAL LOW (ref 22–32)
Calcium: 9.2 mg/dL (ref 8.9–10.3)
Chloride: 107 mmol/L (ref 98–111)
Creatinine, Ser: 1.2 mg/dL (ref 0.61–1.24)
GFR, Estimated: >60 mL/min (ref >60)
Glucose, Bld: 204 mg/dL — ABNORMAL HIGH (ref 70–99)
Potassium: 4 mmol/L (ref 3.5–5.1)
Sodium: 136 mmol/L (ref 135–145)

## 2021-04-25 LAB — BRAIN NATRIURETIC PEPTIDE: B Natriuretic Peptide: 31.2 pg/mL (ref 0.0–100.0)

## 2021-04-25 MED ORDER — FUROSEMIDE 40 MG PO TABS: 40.0000 mg | ORAL_TABLET | ORAL | 6 refills | Status: DC | PRN

## 2021-04-25 NOTE — Patient Instructions (Signed)
Nice to see you today! Change Lasix (furosemide) to take as needed when weight is up 3 pounds in 24 hours or 5 pounds in 1 week. Labwork done today --we will call with any abnormal values.

## 2021-04-25 NOTE — Progress Notes (Signed)
  Heart and Vascular Care Navigation  04/25/2021  Micheal Hardin 17-Sep-1961 [id number]  Reason for Referral: Patient seen in HF TOC.   Engaged with patient face to face for initial visit for Heart and Vascular Care Coordination.                                                                                                   Assessment:   Patient is a 60yo male who is a recent widow. Patient lives with his daughter and son in law in an apartment. Patient has medicare and Medicaid and denies any concerns with medications or medical bills. Patient denies any concerns with SDoH needs such as housing, finances, food or transportation.                                    HRT/VAS Care Coordination     Patients Home Cardiology Office --  HF [facility]   Outpatient Care Team Social Worker   Social Worker Name: Lasandra Beech, [place] [phone]   Living arrangements for the past 2 months Apartment   Lives with: Adult Children; Pets   Patient Current Insurance Coverage Managed Medicare; Medicaid   Patient Has Concern With Paying Medical Bills No   Does Patient Have Prescription Coverage? Yes   Home Assistive Devices/Equipment Scales       Social History:                                                                             SDOH Screenings   Alcohol Screen: Not on file  Depression (JME2-6): Not on file  Financial Resource Strain: Low Risk   . Difficulty of Paying Living Expenses: Not very hard  Food Insecurity: No Food Insecurity  . Worried About Programme researcher, broadcasting/film/video in the Last Year: Never true  . Ran Out of Food in the Last Year: Never true  Housing: Low Risk   . Last Housing Risk Score: 0  Physical Activity: Not on file  Social Connections: Not on file  Stress: Not on file  Tobacco Use: Low Risk   . Smoking Tobacco Use: Never Smoker  . Smokeless Tobacco Use: Never Used  Transportation Needs: No Transportation Needs  . Lack of Transportation (Medical): No  . Lack of  Transportation (Non-Medical): No    SDOH Interventions: Financial Resources:  Corporate treasurer Interventions: Intervention Not Indicated n/a  Food Insecurity:  Food Insecurity Interventions: Intervention Not Indicated  Housing Insecurity:  Housing Interventions: Intervention Not Indicated  Transportation:   Transportation Interventions: Intervention Not Indicated   Follow-up plan:  Patient appears to have all SDoH needs addressed at this time and denies any concerns. Lasandra Beech, LCSW, CCSW-MCS [phone]

## 2021-04-25 NOTE — Progress Notes (Signed)
Heart and Vascular Center Transitions of Care Clinic Heart Failure Pharmacist Encounter  PCP: Patient, No Pcp Per (Inactive) PCP-Cardiologist: None  HPI:  60 yo male with PMH HTN, systolic CHF, and T2DM presents with SOB and substernal CP. Per MD report, he reported that he got SOB walking to the bathroom and while at rest. He was also feeling nauseous and clammy on admission. He reports that he had a cardiac cath in [facility] in November 2021 but no stents were placed. ECHO on 04/16/21 with LVEF 25-30% which is up from 20-25% in June 2020, RV function normal. He was then discharged on 04/18/21.  Today, Micheal Hardin presents to the Heart Failure TOC Clinic for follow up. He complains of still having some shortness of breath but no LE edema or orthopnea/PND. Has dizziness/lightheadedness especially when standing or turning quickly. Has not been checking his BP at home - lives with his daughter. He has been following a low-sodium diet but may drink >64 oz per day. He is taking all medications as prescribed. ReDS 29% (normal 25-35%).  HF Medications: Furosemide 40 mg daily Carvedilol 6.25 mg BID Entresto 24/26 mg BID Spironolactone 12.5 mg daily Jardiance 10 mg daily  Has the patient been experiencing any side effects to the medications prescribed?  No (had dizziness/lightheadedness leading to 3 falls when on spironolactone 25 mg daily in the past)  Does the patient have any problems obtaining medications due to transportation or finances?   No - has Medicaid  Understanding of regimen: good Understanding of indications: good Potential of compliance: good Patient understands to avoid NSAIDs. Patient understands to avoid decongestants.  Vital Signs: . Weight: 180 lbs (discharge weight: 177 lbs) . Blood pressure: 102/80  . Heart rate: 103   Medication Assistance / Insurance Benefits Check: Does the patient have prescription insurance?  Yes Type of insurance plan: U[employer] Medicare + [place]  Medicaid  Outpatient Pharmacy:  Current outpatient pharmacy: [facility] Was the [facility] pharmacy used to supply discharge medications? yes  If TOC pharmacy was used, were the refills transferred out to current pharmacy yet? no  Is the patient willing to transition their outpatient pharmacy to utilize a [facility] outpatient pharmacy with or without mail order?   No  Assessment: 1) Chronic systolic CHF (EF 56-43%), due to NICM. NYHA class II symptoms. - Change furosemide to 40 mg daily PRN - Continue carvedilol 6.25 mg BID - consider increasing to 12.5 mg BID at next visit if BP improves off lasix - Continue Entresto 24/26 mg BID - Continue spironolactone 12.5 mg daily - Continue Jardiance 10 mg daily  Plan: 1) Medication changes: - Reduce furosemide to 40 mg daily PRN  2) Patient Assistance: - None needed - Entresto and Jardiance copays are $4 per month each  3) Follow up: - Next appointment with HF [facility] clinic on 6/22  Sharen Hones, PharmD, BCPS Heart Failure Transitions of Care Clinic Pharmacist [phone]

## 2021-04-26 ENCOUNTER — Encounter (HOSPITAL_COMMUNITY): Payer: Self-pay

## 2021-05-08 ENCOUNTER — Telehealth (HOSPITAL_COMMUNITY): Payer: Self-pay | Admitting: Surgery

## 2021-05-08 NOTE — Telephone Encounter (Signed)
I attempted to reach patient to remind him of appt scheduled tomorrow in the [facility] clinic.  I left a message and asked that he call me back if he has any questions.

## 2021-05-09 ENCOUNTER — Other Ambulatory Visit: Payer: Self-pay

## 2021-05-09 ENCOUNTER — Encounter (HOSPITAL_COMMUNITY): Payer: Self-pay

## 2021-05-09 ENCOUNTER — Ambulatory Visit (HOSPITAL_COMMUNITY)
Admission: RE | Admit: 2021-05-09 | Discharge: 2021-05-09 | Disposition: A | Discharge status: Home / Self Care | Payer: Medicare HMO | Source: Ambulatory Visit | Attending: Internal Medicine | Admitting: Internal Medicine

## 2021-05-09 VITALS — BP 114/78 | HR 86 | Wt 185.6 lb

## 2021-05-09 DIAGNOSIS — I110 Hypertensive heart disease with heart failure: Secondary | ICD-10-CM | POA: Diagnosis not present

## 2021-05-09 DIAGNOSIS — Z79899 Other long term (current) drug therapy: Secondary | ICD-10-CM | POA: Diagnosis not present

## 2021-05-09 DIAGNOSIS — Z7901 Long term (current) use of anticoagulants: Secondary | ICD-10-CM | POA: Diagnosis not present

## 2021-05-09 DIAGNOSIS — I428 Other cardiomyopathies: Secondary | ICD-10-CM | POA: Diagnosis not present

## 2021-05-09 DIAGNOSIS — E119 Type 2 diabetes mellitus without complications: Secondary | ICD-10-CM | POA: Diagnosis not present

## 2021-05-09 DIAGNOSIS — R531 Weakness: Secondary | ICD-10-CM | POA: Diagnosis not present

## 2021-05-09 DIAGNOSIS — I5023 Acute on chronic systolic (congestive) heart failure: Secondary | ICD-10-CM | POA: Insufficient documentation

## 2021-05-09 DIAGNOSIS — Z7982 Long term (current) use of aspirin: Secondary | ICD-10-CM | POA: Insufficient documentation

## 2021-05-09 DIAGNOSIS — R0602 Shortness of breath: Secondary | ICD-10-CM | POA: Insufficient documentation

## 2021-05-09 DIAGNOSIS — Z7984 Long term (current) use of oral hypoglycemic drugs: Secondary | ICD-10-CM | POA: Insufficient documentation

## 2021-05-09 LAB — BASIC METABOLIC PANEL
Anion gap: 13 (ref 5–15)
BUN: 17 mg/dL (ref 8–23)
CO2: 20 mmol/L — ABNORMAL LOW (ref 22–32)
Calcium: 8.8 mg/dL — ABNORMAL LOW (ref 8.9–10.3)
Chloride: 102 mmol/L (ref 98–111)
Creatinine, Ser: 1.01 mg/dL (ref 0.61–1.24)
GFR, Estimated: >60 mL/min (ref >60)
Glucose, Bld: 196 mg/dL — ABNORMAL HIGH (ref 70–99)
Potassium: 4.1 mmol/L (ref 3.5–5.1)
Sodium: 135 mmol/L (ref 135–145)

## 2021-05-09 MED ORDER — POTASSIUM CHLORIDE CRYS ER 20 MEQ PO TBCR: 20.0000 meq | EXTENDED_RELEASE_TABLET | Freq: Two times a day (BID) | ORAL | 0 refills | Status: DC

## 2021-05-09 NOTE — Progress Notes (Unsigned)
ReDS Vest / Clip - 05/09/21 1500       ReDS Vest / Clip   Station Marker C    Ruler Value 31    ReDS Value Range Moderate volume overload    ReDS Actual Value 38

## 2021-05-09 NOTE — Patient Instructions (Addendum)
Labs done today. We will contact you only if your labs are abnormal.  Take lasix 40mg  (1 tablet) by mouth 2 times daily for 2 days then decrease back down to as needed.   START Potassium (1 tablet) by mouth 2 times daily for 2 days.  No other medication changes were made. Please continue all current medications as prescribed.

## 2021-05-09 NOTE — Progress Notes (Signed)
[facility]  PCP: none Primary Cardiologist: none  HPI:  Micheal Hardin is a 60 y.o.  male  with a PMH significant for HTN, HFrEF,  T2DM  Admitted on 04/24/2015 at OSH for worsening SOB, found to have severe LV systolic dysfunction and segmental wall motion abnormality by echocardiography. He underwent transradial coronary angiography on 04/25/2015, which showed no angiographically evident CAD but severe LV dysfunction with LVEF estimated 25-30% with global hypokinesis by LV gram. LVEDP was elevated 23 mmHg. He was subsequently started on medical therapy for nonischemic cardiomyopathy/CHF.  EF of 20-25% on echo from June 2020 when he was admitted to [facility] for gastroenteritis, was not in CHF exacerbation at that time  He reported that he had a cardiac catheterization in the [facility] area in November 2021 but no stents were placed  04/15/21 Admitted for worsening shortness of breath and substernal chest pain.  ED workup HS troponin 29 with repeat troponin of 25.  BNP elevated at 312.2.   BUN 9, creatinine 0.95, normal LFTs.  Admission ECHO 04/16/21 EF 25-30%, septum appears somewhat more hypokinetic than the rest of the LV, RV is normal, valves okay.  Received IV lasix 40mg  x1 then transitioned to oral lasix. Started on ,  his coreg was restarted.  Wt trend 181->178lbs.  Cr 0.95>1.25.    Tells me he was on lasix and carvedilol alone before most recent admission.  Feels better since leaving the hospital.  Still having weakness and dyspnea with activity.  No chest pain.  Does have some orthopnea.  He had some mild dyspnea before   Tried lasix PRN but felt better taking it daily so switched back to daily.  Shortness of breath is about the same maybe slightly better.  Trying to portion control in order to lose weight but has been difficult living with his son in law who is a good cook.  Was able to carry a 24 pack of water from the car to the  apartment and didn't get short of breath.    ROS: All systems negative except as listed in HPI, PMH and Problem List.  SH:  Social History   Socioeconomic History   Marital status: Widowed    Spouse name: Not on file   Number of children: Not on file   Years of education: Not on file   Highest education level: Not on file  Occupational History   Not on file  Tobacco Use   Smoking status: Never   Smokeless tobacco: Never  Substance and Sexual Activity   Alcohol use: Never   Drug use: Yes    Types: Marijuana   Sexual activity: Not on file  Other Topics Concern   Not on file  Social History Narrative   Not on file   Social Determinants of Health   Financial Resource Strain: Low Risk    Difficulty of Paying Living Expenses: Not very hard  Food Insecurity: No Food Insecurity   Worried About Running Out of Food in the Last Year: Never true   Ran Out of Food in the Last Year: Never true  Transportation Needs: No Transportation Needs   Lack of Transportation (Medical): No   Lack of Transportation (Non-Medical): No  Physical Activity: Not on file  Stress: Not on file  Social Connections: Not on file  Intimate Partner Violence: Not on file    FH: History reviewed. No pertinent family history.  Past Medical History:  Diagnosis Date  Arthritis    CHF (congestive heart failure) (HCC)    Diabetes mellitus without complication (HCC)    Hypertension     Current Outpatient Medications  Medication Sig Dispense Refill   aspirin 81 MG EC tablet Take 1 tablet (81 mg total) by mouth daily. Swallow whole. 30 tablet 0   atorvastatin (LIPITOR) 40 MG tablet Take 1 tablet (40 mg total) by mouth daily. 30 tablet 0   carvedilol (COREG) 6.25 MG tablet Take 1 tablet (6.25 mg total) by mouth 2 (two) times daily with a meal. 60 tablet 0   empagliflozin (JARDIANCE) 10 MG TABS tablet Take 1 tablet (10 mg total) by mouth daily. 30 tablet 0   furosemide (LASIX) 40 MG tablet Take 1 tablet (40  mg total) by mouth as needed. 30 tablet 6   metFORMIN (GLUCOPHAGE) 500 MG tablet Take 1 tablet (500 mg total) by mouth 2 (two) times daily with a meal. 60 tablet 0   nitroGLYCERIN (NITROSTAT) 0.4 MG SL tablet Place 0.4 mg under the tongue. Take as needed for chest pain     sacubitril-valsartan (ENTRESTO) 24-26 MG Take 1 tablet by mouth 2 (two) times daily. 60 tablet 0   spironolactone (ALDACTONE) 25 MG tablet Take 1/2 tablet (12.5 mg total) by mouth daily. 30 tablet 0   No current facility-administered medications for this encounter.    Vitals:   05/09/21 1424  BP: 114/78  Pulse: 86  SpO2: 94%  Weight: 84.2 kg (185 lb 9.6 oz)    PHYSICAL EXAM: Cardiac: JVD flat, normal rate and rhythm, clear s1 and s2, no murmurs, rubs or gallops, no LE edema Pulmonary: faint bibasilar rales, not in distress Abdominal: non distended abdomen, soft and nontender Psych: Alert, conversant, in good spirits    ASSESSMENT & PLAN: Chronic Systolic CHF: -NICM, coronary angiography on 04/25/2015, which showed no angiographically evident CAD but severe LV dysfunction with LVEF estimated 25-30% with global hypokinesis by LV gram. LVEDP was elevated 23 mmHg -EF of 20-25% on echo from June 2020 -ECHO 04/16/21 EF 25-30%, septum appears somewhat more hypokinetic than the rest of the LV, RV is normal, valves okay -NYHA Class II symptoms, mildly overloaded on exam REDS 38% -Currently he is on jardiance 10, spiro 12.5, entresto 24/26, carvedilol 6.25mg  BID and lasix 40mg  -last visit dry and hypotensive REDS 29%. We decreased lasix to as needed but he quickly needed to take it daily.  He is used to drinking a lot of fluid and it has been tough to cut back.  In the past he was on lasix 80 BID and was drinking just to keep up with his diuresis.  He actually feels better since last visit when he was dry.  I will double lasix for just two days and we discussed ways of titrating his lasix going forward, he can either restrict  his fluid, weigh himself closely and PRN or if more comfortable he can take daily and not restrict his fluid intake.   -Now that he is on all four guideline therapies as opposed to one, my hope is that he will have an improvement in EF and symptoms in a few months, if not we need to explore other causes possibly starting with cardiac MRI and will also need to consider ICD referral/cpx testing  T2DM: -continue jardiance    Follow up with [facility]

## 2021-05-17 ENCOUNTER — Other Ambulatory Visit (HOSPITAL_COMMUNITY): Payer: Self-pay | Admitting: *Deleted

## 2021-05-17 MED ORDER — ATORVASTATIN CALCIUM 40 MG PO TABS: 40.0000 mg | ORAL_TABLET | Freq: Every day | ORAL | 3 refills | Status: DC

## 2021-05-17 MED ORDER — CARVEDILOL 6.25 MG PO TABS: 6.2500 mg | ORAL_TABLET | Freq: Two times a day (BID) | ORAL | 3 refills | Status: DC

## 2021-05-17 MED ORDER — EMPAGLIFLOZIN 10 MG PO TABS: 10.0000 mg | ORAL_TABLET | Freq: Every day | ORAL | 0 refills | Status: DC

## 2021-05-17 MED ORDER — FUROSEMIDE 40 MG PO TABS: 40.0000 mg | ORAL_TABLET | ORAL | 6 refills | Status: AC | PRN

## 2021-05-17 MED ORDER — SACUBITRIL-VALSARTAN 24-26 MG PO TABS: 1.0000 | ORAL_TABLET | Freq: Two times a day (BID) | ORAL | 3 refills | Status: DC

## 2021-05-17 MED ORDER — SPIRONOLACTONE 25 MG PO TABS: 12.5000 mg | ORAL_TABLET | Freq: Every day | ORAL | 3 refills | Status: AC

## 2021-05-23 ENCOUNTER — Encounter (HOSPITAL_COMMUNITY): Payer: Medicare HMO

## 2021-05-24 ENCOUNTER — Other Ambulatory Visit: Payer: Self-pay

## 2021-05-24 ENCOUNTER — Observation Stay (HOSPITAL_COMMUNITY)
Admission: EM | Admit: 2021-05-24 | Discharge: 2021-05-26 | Disposition: A | Discharge status: Home / Self Care | Payer: Medicare HMO | Attending: Emergency Medicine | Admitting: Emergency Medicine

## 2021-05-24 ENCOUNTER — Encounter (HOSPITAL_COMMUNITY): Payer: Self-pay

## 2021-05-24 ENCOUNTER — Emergency Department (HOSPITAL_COMMUNITY): Payer: Medicare HMO

## 2021-05-24 DIAGNOSIS — Z7982 Long term (current) use of aspirin: Secondary | ICD-10-CM | POA: Insufficient documentation

## 2021-05-24 DIAGNOSIS — E860 Dehydration: Secondary | ICD-10-CM | POA: Insufficient documentation

## 2021-05-24 DIAGNOSIS — I5033 Acute on chronic diastolic (congestive) heart failure: Secondary | ICD-10-CM | POA: Diagnosis not present

## 2021-05-24 DIAGNOSIS — U071 COVID-19: Secondary | ICD-10-CM | POA: Diagnosis not present

## 2021-05-24 DIAGNOSIS — E119 Type 2 diabetes mellitus without complications: Secondary | ICD-10-CM | POA: Diagnosis not present

## 2021-05-24 DIAGNOSIS — Z7984 Long term (current) use of oral hypoglycemic drugs: Secondary | ICD-10-CM | POA: Diagnosis not present

## 2021-05-24 DIAGNOSIS — Z79899 Other long term (current) drug therapy: Secondary | ICD-10-CM | POA: Insufficient documentation

## 2021-05-24 DIAGNOSIS — R112 Nausea with vomiting, unspecified: Secondary | ICD-10-CM | POA: Diagnosis present

## 2021-05-24 DIAGNOSIS — I110 Hypertensive heart disease with heart failure: Secondary | ICD-10-CM | POA: Diagnosis not present

## 2021-05-24 LAB — CBC WITH DIFFERENTIAL/PLATELET
Abs Immature Granulocytes: 0.1 10*3/uL — ABNORMAL HIGH (ref 0.00–0.07)
Basophils Absolute: 0.1 10*3/uL (ref 0.0–0.1)
Basophils Relative: 0 %
Eosinophils Absolute: 0 10*3/uL (ref 0.0–0.5)
Eosinophils Relative: 0 %
HCT: 43.8 % (ref 39.0–52.0)
Hemoglobin: 14.6 g/dL (ref 13.0–17.0)
Immature Granulocytes: 1 %
Lymphocytes Relative: 3 %
Lymphs Abs: 0.5 10*3/uL — ABNORMAL LOW (ref 0.7–4.0)
MCH: 30 pg (ref 26.0–34.0)
MCHC: 33.3 g/dL (ref 30.0–36.0)
MCV: 90.1 fL (ref 80.0–100.0)
Monocytes Absolute: 1.3 10*3/uL — ABNORMAL HIGH (ref 0.1–1.0)
Monocytes Relative: 8 %
Neutro Abs: 13.1 10*3/uL — ABNORMAL HIGH (ref 1.7–7.7)
Neutrophils Relative %: 88 %
Platelets: 213 10*3/uL (ref 150–400)
RBC: 4.86 MIL/uL (ref 4.22–5.81)
RDW: 13.9 % (ref 11.5–15.5)
WBC: 15 10*3/uL — ABNORMAL HIGH (ref 4.0–10.5)
nRBC: 0 % (ref 0.0–0.2)

## 2021-05-24 LAB — COMPREHENSIVE METABOLIC PANEL
ALT: 24 U/L (ref 0–44)
AST: 32 U/L (ref 15–41)
Albumin: 4.6 g/dL (ref 3.5–5.0)
Alkaline Phosphatase: 96 U/L (ref 38–126)
Anion gap: 15 (ref 5–15)
BUN: 19 mg/dL (ref 8–23)
CO2: 21 mmol/L — ABNORMAL LOW (ref 22–32)
Calcium: 9.4 mg/dL (ref 8.9–10.3)
Chloride: 101 mmol/L (ref 98–111)
Creatinine, Ser: 1.28 mg/dL — ABNORMAL HIGH (ref 0.61–1.24)
GFR, Estimated: >60 mL/min (ref >60)
Glucose, Bld: 249 mg/dL — ABNORMAL HIGH (ref 70–99)
Potassium: 3.7 mmol/L (ref 3.5–5.1)
Sodium: 137 mmol/L (ref 135–145)
Total Bilirubin: 1.4 mg/dL — ABNORMAL HIGH (ref 0.3–1.2)
Total Protein: 8.6 g/dL — ABNORMAL HIGH (ref 6.5–8.1)

## 2021-05-24 LAB — LIPASE, BLOOD: Lipase: 43 U/L (ref 11–51)

## 2021-05-24 LAB — CBG MONITORING, ED: Glucose-Capillary: 261 mg/dL — ABNORMAL HIGH (ref 70–99)

## 2021-05-24 MED ORDER — ONDANSETRON HCL 4 MG/2ML IJ SOLN
4.0000 mg | Freq: Once | INTRAMUSCULAR | Status: AC
  Administered 2021-05-24: 4 mg via INTRAVENOUS
  Filled 2021-05-24: qty 2

## 2021-05-24 MED ORDER — ACETAMINOPHEN 325 MG PO TABS
650.0000 mg | ORAL_TABLET | Freq: Once | ORAL | Status: AC
  Administered 2021-05-24: 650 mg via ORAL
  Filled 2021-05-24: qty 2

## 2021-05-24 MED ORDER — SODIUM CHLORIDE 0.9 % IV BOLUS
500.0000 mL | Freq: Once | INTRAVENOUS | Status: AC
  Administered 2021-05-24: 500 mL via INTRAVENOUS

## 2021-05-24 NOTE — ED Triage Notes (Signed)
Pt reports nausea vomiting, shortness of breath and generalized abdominal pain worsening today.

## 2021-05-24 NOTE — ED Provider Notes (Signed)
[facility]-EMERGENCY DEPT Provider Note   CSN: [id number] Arrival date & time: 05/24/21  2009     History Chief Complaint  Patient presents with   Vomiting    Jestin Burbach is a 60 y.o. male with a history of chronic systolic congestive heart failure, diabetes mellitus type 2, HTN who presents to the emergency department with a chief complaint of vomiting.  The patient reports sudden onset nausea, vomiting, diarrhea, myalgias, headache, nonproductive cough, mild shortness of breath, and bilateral mid back pain, onset today.  He reports that his daughter was ill with similar symptoms that began yesterday.  Back pain has been constant.  It is nonradiating.  He describes it as aching and throbbing.  No recent falls or injuries.  He does state that he has a history of chronic back pain and this feels similar, but more intense than at baseline.  He denies chest pain, abdominal pain, dizziness, lightheadedness, syncope, melena, hematochezia, numbness, weakness, visual changes, or fever.  Denies urinary complaints.  He is fully vaccinated against COVID 19, but has not received a booster.  No recent long travel.  He has been compliant with his home medications.  The history is provided by the patient and medical records. No language interpreter was used.      Past Medical History:  Diagnosis Date   Arthritis    CHF (congestive heart failure) (HCC)    Diabetes mellitus without complication (HCC)    Hypertension     Patient Active Problem List   Diagnosis Date Noted   Acute on chronic systolic CHF (congestive heart failure) (HCC) 04/15/2021   Chest pain 04/15/2021   Diabetes mellitus type 2, uncontrolled (HCC) 04/15/2021   Hypokalemia 04/15/2021   Intractable nausea and vomiting 05/08/2019    Past Surgical History:  Procedure Laterality Date   SPLENECTOMY, TOTAL     TONSILLECTOMY         No family history on file.  Social History   Tobacco Use    Smoking status: Never   Smokeless tobacco: Never  Substance Use Topics   Alcohol use: Never   Drug use: Yes    Types: Marijuana    Home Medications Prior to Admission medications   Medication Sig Start Date End Date Taking? Authorizing Provider  aspirin 81 MG EC tablet Take 1 tablet (81 mg total) by mouth daily. Swallow whole. 04/18/21   Ghimire, Werner Lean, MD  atorvastatin (LIPITOR) 40 MG tablet Take 1 tablet (40 mg total) by mouth daily. 05/17/21   Angelita Ingles, MD  carvedilol (COREG) 6.25 MG tablet Take 1 tablet (6.25 mg total) by mouth 2 (two) times daily with a meal. 05/17/21 06/16/21  Angelita Ingles, MD  empagliflozin (JARDIANCE) 10 MG TABS tablet Take 1 tablet (10 mg total) by mouth daily. 05/17/21   Angelita Ingles, MD  furosemide (LASIX) 40 MG tablet Take 1 tablet (40 mg total) by mouth as needed. 05/17/21   Angelita Ingles, MD  metFORMIN (GLUCOPHAGE) 500 MG tablet Take 1 tablet (500 mg total) by mouth 2 (two) times daily with a meal. 04/18/21 04/18/22  Ghimire, Werner Lean, MD  nitroGLYCERIN (NITROSTAT) 0.4 MG SL tablet Place 0.4 mg under the tongue. Take as needed for chest pain    [provider]  potassium chloride SA (KLOR-CON) 20 MEQ tablet Take 1 tablet (20 mEq total) by mouth 2 (two) times daily for 2 days. 05/09/21 05/11/21  Angelita Ingles, MD  sacubitril-valsartan (ENTRESTO) 24-26 MG  Take 1 tablet by mouth 2 (two) times daily. 05/17/21   Angelita Ingles, MD  spironolactone (ALDACTONE) 25 MG tablet Take 1/2 tablet (12.5 mg total) by mouth daily. 05/17/21   Angelita Ingles, MD    Allergies    Patient has no known allergies.  Review of Systems   Review of Systems  Constitutional:  Positive for chills. Negative for appetite change, fatigue and fever.  Respiratory:  Positive for cough and shortness of breath. Negative for choking and wheezing.   Cardiovascular:  Negative for chest pain and palpitations.  Gastrointestinal:  Positive for diarrhea, nausea  and vomiting. Negative for abdominal pain and constipation.  Genitourinary:  Negative for dysuria.  Musculoskeletal:  Positive for myalgias. Negative for back pain.  Skin:  Negative for rash.  Allergic/Immunologic: Negative for immunocompromised state.  Neurological:  Negative for dizziness, seizures, syncope, weakness, light-headedness and headaches.  Psychiatric/Behavioral:  Negative for confusion.    Physical Exam Updated Vital Signs BP 117/73   Pulse 99   Temp 98.6 F (37 C) (Oral)   Resp 19   Ht 5\' 7"  (1.702 m)   Wt 84.3 kg   SpO2 97%   BMI 29.12 kg/m   Physical Exam Vitals and nursing note reviewed.  Constitutional:      Appearance: He is well-developed.  HENT:     Head: Normocephalic.  Eyes:     Conjunctiva/sclera: Conjunctivae normal.  Cardiovascular:     Rate and Rhythm: Regular rhythm. Tachycardia present.     Pulses: Normal pulses.     Heart sounds: Normal heart sounds. No murmur heard.   No friction rub. No gallop.  Pulmonary:     Effort: Pulmonary effort is normal. No respiratory distress.     Breath sounds: No stridor. No wheezing, rhonchi or rales.     Comments: Lungs are clear to auscultation bilaterally.  Dry, hacking cough noted on exam. Abdominal:     General: There is no distension.     Palpations: Abdomen is soft. There is no mass.     Tenderness: There is no abdominal tenderness. There is no right CVA tenderness, left CVA tenderness, guarding or rebound.     Hernia: No hernia is present.     Comments: Abdomen is soft, nontender, nondistended.  No CVA tenderness bilaterally.  Musculoskeletal:     Cervical back: Neck supple.     Right lower leg: No edema.     Left lower leg: No edema.     Comments: Spine is nontender to palpation.  No crepitus or step-offs.  Skin:    General: Skin is warm and dry.     Capillary Refill: Capillary refill takes less than 2 seconds.  Neurological:     Mental Status: He is alert.  Psychiatric:        Behavior:  Behavior normal.    ED Results / Procedures / Treatments   Labs (all labs ordered are listed, but only abnormal results are displayed) Labs Reviewed  RESP PANEL BY RT-PCR (FLU A&B, COVID) ARPGX2 - Abnormal; Notable for the following components:      Result Value   SARS Coronavirus 2 by RT PCR POSITIVE (*)    All other components within normal limits  COMPREHENSIVE METABOLIC PANEL - Abnormal; Notable for the following components:   CO2 21 (*)    Glucose, Bld 249 (*)    Creatinine, Ser 1.28 (*)    Total Protein 8.6 (*)    Total Bilirubin 1.4 (*)  All other components within normal limits  CBC WITH DIFFERENTIAL/PLATELET - Abnormal; Notable for the following components:   WBC 15.0 (*)    Neutro Abs 13.1 (*)    Lymphs Abs 0.5 (*)    Monocytes Absolute 1.3 (*)    Abs Immature Granulocytes 0.10 (*)    All other components within normal limits  CBG MONITORING, ED - Abnormal; Notable for the following components:   Glucose-Capillary 261 (*)    All other components within normal limits  LIPASE, BLOOD  URINALYSIS, ROUTINE W REFLEX MICROSCOPIC    EKG EKG Interpretation  Date/Time:  Thursday May 24 2021 22:48:58 EDT Ventricular Rate:  109 PR Interval:  159 QRS Duration: 135 QT Interval:  379 QTC Calculation: 511 R Axis:   -44 Text Interpretation: Sinus tachycardia Left bundle branch block No significant change was found Confirmed by Glynn Octave [phone]) on 05/25/2021 12:21:34 AM  Radiology CT Angio Chest PE W and/or Wo Contrast  Result Date: 05/25/2021 CLINICAL DATA:  Shortness of breath, leukocytosis, COVID positive EXAM: CT ANGIOGRAPHY CHEST WITH CONTRAST TECHNIQUE: Multidetector CT imaging of the chest was performed using the standard protocol during bolus administration of intravenous contrast. Multiplanar CT image reconstructions and MIPs were obtained to evaluate the vascular anatomy. CONTRAST:  OMNIPAQUE IOHEXOL 350 MG/ML SOLN COMPARISON:  Chest radiograph dated  05/24/2021 FINDINGS: Cardiovascular: Satisfactory opacification the bilateral pulmonary arteries to the segmental level. However, evaluation is also constrained by respiratory motion, particularly in the bilateral lower lobes. Within that constraint, there is no evidence of pulmonary embolism. Although not tailored for evaluation of the thoracic aorta, there is no evidence of thoracic aortic aneurysm or dissection. The heart is top-normal in size.  No pericardial effusion. Three vessel coronary atherosclerosis. Mediastinum/Nodes: No suspicious mediastinal lymphadenopathy. Visualized thyroid is unremarkable. Lungs/Pleura: Evaluation is constrained by respiratory motion. Mild patchy opacities in the bilateral lower lobes, favoring atelectasis. Very mild subpleural ground-glass opacity in the upper lobes, possibly related to the patient's known COVID. No suspicious pulmonary nodules. No pleural effusion or pneumothorax. Upper Abdomen: Visualized upper abdomen is grossly unremarkable. Musculoskeletal: Degenerative changes of the visualized thoracolumbar spine. Review of the MIP images confirms the above findings. IMPRESSION: No evidence of pulmonary embolism. Very mild subpleural ground-glass opacity in the upper lobes, possibly related to the patient's known COVID. Mild dependent atelectasis in the bilateral lower lobes. Electronically Signed   By: Charline Bills M.D.   On: 05/25/2021 03:56   DG Chest Portable 1 View  Result Date: 05/24/2021 CLINICAL DATA:  Shortness of breath.  Cough. EXAM: PORTABLE CHEST 1 VIEW.  Patient is rotated. COMPARISON:  Chest x-ray 04/15/2021 FINDINGS: The heart size and mediastinal contours are within normal limits. Aortic calcifications. Left elevated hemidiaphragm. Left base atelectasis. No focal consolidation. No pulmonary edema. No pleural effusion. No pneumothorax. No acute osseous abnormality. IMPRESSION: No active disease. Electronically Signed   By: Tish Frederickson M.D.   On:  05/24/2021 22:53    Procedures .Critical Care  Date/Time: 05/25/2021 4:03 AM Performed by: Barkley Boards, PA-C Authorized by: Barkley Boards, PA-C   Critical care provider statement:    Critical care time (minutes):  35   Critical care time was exclusive of:  Separately billable procedures and treating other patients and teaching time   Critical care was necessary to treat or prevent imminent or life-threatening deterioration of the following conditions:  Circulatory failure and respiratory failure   Critical care was time spent personally by me on the following activities:  Ordering and performing treatments and interventions, ordering and review of laboratory studies, ordering and review of radiographic studies, pulse oximetry, re-evaluation of patient's condition, review of old charts, obtaining history from patient or surrogate, examination of patient, evaluation of patient's response to treatment and development of treatment plan with patient or surrogate   I assumed direction of critical care for this patient from another provider in my specialty: no     Care discussed with: admitting provider     Medications Ordered in ED Medications  lactated ringers infusion ( Intravenous Stopped 05/25/21 0110)  sodium chloride (PF) 0.9 % injection (has no administration in time range)  sodium chloride 0.9 % bolus 500 mL (0 mLs Intravenous Stopped 05/25/21 0018)  ondansetron (ZOFRAN) injection 4 mg (4 mg Intravenous Given 05/24/21 2250)  acetaminophen (TYLENOL) tablet 650 mg (650 mg Oral Given 05/24/21 2248)  lactated ringers infusion ( Intravenous Stopped 05/25/21 0019)  iohexol (OMNIPAQUE) 350 MG/ML injection 100 mL (100 mLs Intravenous Contrast Given 05/25/21 1423)    ED Course  I have reviewed the triage vital signs and the nursing notes.  Pertinent labs & imaging results that were available during my care of the patient were reviewed by me and considered in my medical decision making (see chart for  details).  Clinical Course as of 05/25/21 0404  Caleen Essex May 25, 2021  0122 RN reports that patient has been having recurrent episodes of hypoxia to 88%.  [MM]    Clinical Course User Index [MM] Chasidy Janak, Coral Else, PA-C   MDM Rules/Calculators/A&P                          60 year old male with a history of chronic systolic congestive heart failure, diabetes mellitus type 2, HTN who presents the emergency department with a 1 day history of nausea, vomiting, diarrhea, myalgias, chills, shortness of breath, nonproductive cough, and headache.  The patient's daughter has been ill with similar symptoms that began yesterday.  He is fully vaccinated against COVID-19, but has not received a booster.  He has mild tachycardia.  Blood pressures are soft at 90s over 50s.  Afebrile.  No hypoxia.  No increased work of breathing.  Lungs are clear to auscultation bilaterally on exam.  Abdomen is benign.  Labs and imaging been reviewed and independently interpreted by me. Chest x-ray is unremarkable.  Glucose is elevated at 249.  Anion gap is normal.  Bicarb is slightly decreased to 21 and patient has an AKI, which I suspect is from volume depletion from vomiting and diarrhea.  He has a leukocytosis of 15.  No left shift.  We will add on COVID-19 test.  Urinalysis is also pending.  Will give 500 cc of fluids given his history of CHF with an EF of 25-30.  Zofran given for nausea and will give Tylenol for headache.  On reevaluation, patient continues to have mild tachycardia.  Pressures have been soft, 80s over 50s, but MAP is above 65.  I suspect that this partially may be due to position.  It looks like his baseline blood pressure is 100/80.  He has been also having intermittent episodes of hypoxia to 88%.  Given his history of heart failure, dehydration, in the setting of COVID-19 infection with intermittent hypoxia, consulted Dr. Loney Loh for admission.  She is concerned that given intermittent hypoxia in the setting of  COVID-19 with soft blood pressures that patient may need a PE study.  She is requesting a  call back after this has been obtained.  CT with no evidence of PE.  There is mild dependent atelectasis in the bilateral lower lobes and subpleural groundglass opacities in the upper lobes that is possibly related to patient's known COVID-19 infection.  Dr. Loney Lohathore will accept the patient for admission. The patient appears reasonably stabilized for admission considering the current resources, flow, and capabilities available in the ED at this time, and I doubt any other [facility] further screening and/or treatment in the ED prior to admission.   Final Clinical Impression(s) / ED Diagnoses Final diagnoses:  COVID-19  Dehydration    Rx / DC Orders ED Discharge Orders     None        Alexie Samson A, PA-C 05/25/21 0404    Glynn Octaveancour, Stephen, MD 05/25/21 2019

## 2021-05-25 ENCOUNTER — Emergency Department (HOSPITAL_COMMUNITY): Payer: Medicare HMO

## 2021-05-25 ENCOUNTER — Encounter (HOSPITAL_COMMUNITY): Payer: Self-pay

## 2021-05-25 DIAGNOSIS — U071 COVID-19: Secondary | ICD-10-CM | POA: Diagnosis present

## 2021-05-25 LAB — RESP PANEL BY RT-PCR (FLU A&B, COVID) ARPGX2
Influenza A by PCR: NEGATIVE
Influenza B by PCR: NEGATIVE
SARS Coronavirus 2 by RT PCR: POSITIVE — AB

## 2021-05-25 LAB — COMPREHENSIVE METABOLIC PANEL
ALT: 26 U/L (ref 0–44)
AST: 42 U/L — ABNORMAL HIGH (ref 15–41)
Albumin: 3.9 g/dL (ref 3.5–5.0)
Alkaline Phosphatase: 71 U/L (ref 38–126)
Anion gap: 10 (ref 5–15)
BUN: 23 mg/dL (ref 8–23)
CO2: 21 mmol/L — ABNORMAL LOW (ref 22–32)
Calcium: 8.3 mg/dL — ABNORMAL LOW (ref 8.9–10.3)
Chloride: 104 mmol/L (ref 98–111)
Creatinine, Ser: 1.24 mg/dL (ref 0.61–1.24)
GFR, Estimated: >60 mL/min (ref >60)
Glucose, Bld: 179 mg/dL — ABNORMAL HIGH (ref 70–99)
Potassium: 4 mmol/L (ref 3.5–5.1)
Sodium: 135 mmol/L (ref 135–145)
Total Bilirubin: 1.1 mg/dL (ref 0.3–1.2)
Total Protein: 7.6 g/dL (ref 6.5–8.1)

## 2021-05-25 LAB — CBC WITH DIFFERENTIAL/PLATELET
Abs Immature Granulocytes: 0.05 10*3/uL (ref 0.00–0.07)
Basophils Absolute: 0 10*3/uL (ref 0.0–0.1)
Basophils Relative: 0 %
Eosinophils Absolute: 0 10*3/uL (ref 0.0–0.5)
Eosinophils Relative: 0 %
HCT: 40 % (ref 39.0–52.0)
Hemoglobin: 13.3 g/dL (ref 13.0–17.0)
Immature Granulocytes: 1 %
Lymphocytes Relative: 5 %
Lymphs Abs: 0.5 10*3/uL — ABNORMAL LOW (ref 0.7–4.0)
MCH: 30.1 pg (ref 26.0–34.0)
MCHC: 33.3 g/dL (ref 30.0–36.0)
MCV: 90.5 fL (ref 80.0–100.0)
Monocytes Absolute: 1.5 10*3/uL — ABNORMAL HIGH (ref 0.1–1.0)
Monocytes Relative: 14 %
Neutro Abs: 8.7 10*3/uL — ABNORMAL HIGH (ref 1.7–7.7)
Neutrophils Relative %: 80 %
Platelets: 201 10*3/uL (ref 150–400)
RBC: 4.42 MIL/uL (ref 4.22–5.81)
RDW: 14.4 % (ref 11.5–15.5)
WBC: 10.8 10*3/uL — ABNORMAL HIGH (ref 4.0–10.5)
nRBC: 0 % (ref 0.0–0.2)

## 2021-05-25 LAB — URINALYSIS, ROUTINE W REFLEX MICROSCOPIC
Bacteria, UA: NONE SEEN
Bilirubin Urine: NEGATIVE
Glucose, UA: >=500 mg/dL — AB
Hgb urine dipstick: NEGATIVE
Ketones, ur: NEGATIVE mg/dL
Leukocytes,Ua: NEGATIVE
Nitrite: NEGATIVE
Protein, ur: NEGATIVE mg/dL
Specific Gravity, Urine: 1.038 — ABNORMAL HIGH (ref 1.005–1.030)
pH: 6 (ref 5.0–8.0)

## 2021-05-25 LAB — CBG MONITORING, ED: Glucose-Capillary: 176 mg/dL — ABNORMAL HIGH (ref 70–99)

## 2021-05-25 LAB — GLUCOSE, CAPILLARY
Glucose-Capillary: 121 mg/dL — ABNORMAL HIGH (ref 70–99)
Glucose-Capillary: 110 mg/dL — ABNORMAL HIGH (ref 70–99)

## 2021-05-25 MED ORDER — ZINC SULFATE 220 (50 ZN) MG PO CAPS
220.0000 mg | ORAL_CAPSULE | Freq: Every day | ORAL | Status: DC
  Administered 2021-05-25 – 2021-05-26 (×2): 220 mg via ORAL
  Filled 2021-05-25 (×2): qty 1

## 2021-05-25 MED ORDER — ASPIRIN EC 81 MG PO TBEC
81.0000 mg | DELAYED_RELEASE_TABLET | Freq: Every day | ORAL | Status: DC
  Administered 2021-05-25 – 2021-05-26 (×2): 81 mg via ORAL
  Filled 2021-05-25 (×2): qty 1

## 2021-05-25 MED ORDER — HYDROCOD POLST-CPM POLST ER 10-8 MG/5ML PO SUER: 5.0000 mL | Freq: Two times a day (BID) | ORAL | Status: DC | PRN

## 2021-05-25 MED ORDER — SODIUM CHLORIDE 0.9 % IV SOLN
200.0000 mg | Freq: Once | INTRAVENOUS | Status: AC
  Administered 2021-05-25: 200 mg via INTRAVENOUS
  Filled 2021-05-25: qty 40

## 2021-05-25 MED ORDER — INSULIN ASPART 100 UNIT/ML IJ SOLN
0.0000 [IU] | Freq: Every day | INTRAMUSCULAR | Status: DC
  Filled 2021-05-25: qty 0.05

## 2021-05-25 MED ORDER — PROCHLORPERAZINE EDISYLATE 10 MG/2ML IJ SOLN: 10.0000 mg | Freq: Four times a day (QID) | INTRAMUSCULAR | Status: DC | PRN

## 2021-05-25 MED ORDER — PROCHLORPERAZINE EDISYLATE 10 MG/2ML IJ SOLN
10.0000 mg | Freq: Once | INTRAMUSCULAR | Status: AC
  Administered 2021-05-25: 10 mg via INTRAVENOUS
  Filled 2021-05-25: qty 2

## 2021-05-25 MED ORDER — GUAIFENESIN-DM 100-10 MG/5ML PO SYRP: 10.0000 mL | ORAL_SOLUTION | ORAL | Status: DC | PRN

## 2021-05-25 MED ORDER — ATORVASTATIN CALCIUM 40 MG PO TABS
40.0000 mg | ORAL_TABLET | Freq: Every day | ORAL | Status: DC
  Administered 2021-05-25 – 2021-05-26 (×2): 40 mg via ORAL
  Filled 2021-05-25 (×2): qty 1

## 2021-05-25 MED ORDER — SODIUM CHLORIDE 0.9 % IV SOLN: 200.0000 mg | Freq: Once | INTRAVENOUS | Status: DC

## 2021-05-25 MED ORDER — SODIUM CHLORIDE (PF) 0.9 % IJ SOLN
INTRAMUSCULAR | Status: AC
  Filled 2021-05-25: qty 50

## 2021-05-25 MED ORDER — ASCORBIC ACID 500 MG PO TABS
500.0000 mg | ORAL_TABLET | Freq: Every day | ORAL | Status: DC
  Administered 2021-05-25 – 2021-05-26 (×2): 500 mg via ORAL
  Filled 2021-05-25 (×2): qty 1

## 2021-05-25 MED ORDER — INSULIN ASPART 100 UNIT/ML IJ SOLN
0.0000 [IU] | Freq: Three times a day (TID) | INTRAMUSCULAR | Status: DC
  Administered 2021-05-25: 2 [IU] via SUBCUTANEOUS
  Administered 2021-05-25: 3 [IU] via SUBCUTANEOUS
  Filled 2021-05-25: qty 0.15

## 2021-05-25 MED ORDER — ACETAMINOPHEN 325 MG PO TABS
650.0000 mg | ORAL_TABLET | Freq: Four times a day (QID) | ORAL | Status: DC | PRN
  Administered 2021-05-25 (×2): 650 mg via ORAL
  Filled 2021-05-25 (×2): qty 2

## 2021-05-25 MED ORDER — SODIUM CHLORIDE 0.9 % IV SOLN
100.0000 mg | Freq: Every day | INTRAVENOUS | Status: DC
  Administered 2021-05-26: 100 mg via INTRAVENOUS
  Filled 2021-05-25: qty 20

## 2021-05-25 MED ORDER — IOHEXOL 350 MG/ML SOLN
100.0000 mL | Freq: Once | INTRAVENOUS | Status: AC | PRN
  Administered 2021-05-25: 100 mL via INTRAVENOUS

## 2021-05-25 MED ORDER — ENOXAPARIN SODIUM 40 MG/0.4ML IJ SOSY
40.0000 mg | PREFILLED_SYRINGE | INTRAMUSCULAR | Status: DC
  Administered 2021-05-25: 40 mg via SUBCUTANEOUS
  Filled 2021-05-25: qty 0.4

## 2021-05-25 MED ORDER — SODIUM CHLORIDE 0.9 % IV SOLN: 100.0000 mg | Freq: Every day | INTRAVENOUS | Status: DC

## 2021-05-25 MED ORDER — TRAMADOL HCL 50 MG PO TABS
50.0000 mg | ORAL_TABLET | Freq: Four times a day (QID) | ORAL | Status: DC | PRN
  Administered 2021-05-25 – 2021-05-26 (×3): 50 mg via ORAL
  Filled 2021-05-25 (×3): qty 1

## 2021-05-25 MED ORDER — LACTATED RINGERS IV SOLN: Freq: Once | INTRAVENOUS | Status: AC

## 2021-05-25 MED ORDER — LACTATED RINGERS IV SOLN: INTRAVENOUS | Status: DC

## 2021-05-25 NOTE — ED Notes (Signed)
Pt received breakfast tray 

## 2021-05-25 NOTE — ED Notes (Signed)
Pt asking for pain medication for headache, messaged provider to make aware.  Await call back.

## 2021-05-25 NOTE — ED Notes (Signed)
Pt received lunch tray 

## 2021-05-25 NOTE — ED Notes (Signed)
Dr.Rathore ordered tylenol for pt's headache pain.

## 2021-05-25 NOTE — ED Notes (Signed)
Vital signs stable. 

## 2021-05-25 NOTE — H&P (Signed)
History and Physical    Micheal Hardin DJT:[id number] DOB: [birth year] DOA: 05/24/2021  PCP: Patient, No Pcp Per (Inactive)  Patient coming from: Home  Chief Complaint: N/V  HPI: Micheal Hardin is a 60 y.o. male with medical history significant of DM2, HTN, combined HF. Presenting with N/V. Reports that he had a headache yesterday morning. He didn't think much of it, but then he started having episodes of nausea and vomiting. He then felt fevers and chills. He states, "I felt as if I had severe flu." His symptoms worsened throughout the day. His family finally convinced him to come to the ED to be evaluated. He denies any other aggravating or alleviating factors.   ED Course: He was found to be COVID positive. CTA chest showed opacities in the upper lobes. He was hypotensive. He was given fluids. TRH was called for admission.   Review of Systems:  Denies CP, dyspnea, palpitations, diarrhea. Reports N/V, chills, subjective fevers, productive cough. Review of systems is otherwise negative for all not mentioned in HPI.   PMHx Past Medical History:  Diagnosis Date   Arthritis    CHF (congestive heart failure) (HCC)    Diabetes mellitus without complication (HCC)    Hypertension     PSHx Past Surgical History:  Procedure Laterality Date   SPLENECTOMY, TOTAL     TONSILLECTOMY      SocHx  reports that he has never smoked. He has never used smokeless tobacco. He reports current drug use. Drug: Marijuana. He reports that he does not drink alcohol.  No Known Allergies  FamHx No family history on file.  Prior to Admission medications   Medication Sig Start Date End Date Taking? Authorizing Provider  aspirin 81 MG EC tablet Take 1 tablet (81 mg total) by mouth daily. Swallow whole. 04/18/21  Yes Ghimire, Werner Lean, MD  atorvastatin (LIPITOR) 40 MG tablet Take 1 tablet (40 mg total) by mouth daily. 05/17/21  Yes Angelita Ingles, MD  carvedilol (COREG) 6.25 MG tablet Take 1 tablet  (6.25 mg total) by mouth 2 (two) times daily with a meal. 05/17/21 06/16/21 Yes Winfrey, Kimberlee Nearing, MD  empagliflozin (JARDIANCE) 10 MG TABS tablet Take 1 tablet (10 mg total) by mouth daily. 05/17/21  Yes Angelita Ingles, MD  furosemide (LASIX) 40 MG tablet Take 1 tablet (40 mg total) by mouth as needed. Patient taking differently: Take 40 mg by mouth daily. 05/17/21  Yes Angelita Ingles, MD  metFORMIN (GLUCOPHAGE) 500 MG tablet Take 1 tablet (500 mg total) by mouth 2 (two) times daily with a meal. 04/18/21 04/18/22 Yes Ghimire, Werner Lean, MD  nitroGLYCERIN (NITROSTAT) 0.4 MG SL tablet Place 0.4 mg under the tongue every 5 (five) minutes as needed for chest pain.   Yes [provider]  Pseudoeph-CPM-DM-APAP (COUGH COLD/FLU RELIEF M PO) Take 2 tablets by mouth every 6 (six) hours as needed (cough).   Yes [provider]  sacubitril-valsartan (ENTRESTO) 24-26 MG Take 1 tablet by mouth 2 (two) times daily. 05/17/21  Yes Angelita Ingles, MD  spironolactone (ALDACTONE) 25 MG tablet Take 1/2 tablet (12.5 mg total) by mouth daily. 05/17/21  Yes Angelita Ingles, MD  potassium chloride SA (KLOR-CON) 20 MEQ tablet Take 1 tablet (20 mEq total) by mouth 2 (two) times daily for 2 days. 05/09/21 05/11/21  Angelita Ingles, MD    Physical Exam: Vitals:   05/25/21 0530 05/25/21 0600 05/25/21 0630 05/25/21 0700  BP: 107/79 109/82 114/73 96/83  Pulse:  98 99 99 93  Resp: 16 17 17 16   Temp:      TempSrc:      SpO2: 94% 93% 92% 92%  Weight:      Height:        General: 60 y.o. male resting in bed in NAD Eyes: PERRL, normal sclera ENMT: Nares patent w/o discharge, orophaynx clear, dentition normal, ears w/o discharge/lesions/ulcers Neck: Supple, trachea midline Cardiovascular: RRR, +S1, S2, no m/g/r, equal pulses throughout Respiratory: CTABL, no w/r/r, normal WOB GI: BS+, NDNT, no masses noted, no organomegaly noted MSK: No e/c/c Skin: No rashes, bruises, ulcerations noted Neuro:  A&O x 3, no focal deficits Psyc: Appropriate interaction and affect, calm/cooperative  Labs on Admission: I have personally reviewed following labs and imaging studies  CBC: Recent Labs  Lab 05/24/21 2101  WBC 15.0*  NEUTROABS 13.1*  HGB 14.6  HCT 43.8  MCV 90.1  PLT 213   Basic Metabolic Panel: Recent Labs  Lab 05/24/21 2101  NA 137  K 3.7  CL 101  CO2 21*  GLUCOSE 249*  BUN 19  CREATININE 1.28*  CALCIUM 9.4   GFR: Estimated Creatinine Clearance: 62.1 mL/min (A) (by C-G formula based on SCr of 1.28 mg/dL (H)). Liver Function Tests: Recent Labs  Lab 05/24/21 2101  AST 32  ALT 24  ALKPHOS 96  BILITOT 1.4*  PROT 8.6*  ALBUMIN 4.6   Recent Labs  Lab 05/24/21 2101  LIPASE 43   No results for input(s): AMMONIA in the last 168 hours. Coagulation Profile: No results for input(s): INR, PROTIME in the last 168 hours. Cardiac Enzymes: No results for input(s): CKTOTAL, CKMB, CKMBINDEX, TROPONINI in the last 168 hours. BNP (last 3 results) No results for input(s): PROBNP in the last 8760 hours. HbA1C: No results for input(s): HGBA1C in the last 72 hours. CBG: Recent Labs  Lab 05/24/21 2103  GLUCAP 261*   Lipid Profile: No results for input(s): CHOL, HDL, LDLCALC, TRIG, CHOLHDL, LDLDIRECT in the last 72 hours. Thyroid Function Tests: No results for input(s): TSH, T4TOTAL, FREET4, T3FREE, THYROIDAB in the last 72 hours. Anemia Panel: No results for input(s): VITAMINB12, FOLATE, FERRITIN, TIBC, IRON, RETICCTPCT in the last 72 hours. Urine analysis:    Component Value Date/Time   COLORURINE YELLOW 05/25/2021 0441   APPEARANCEUR CLEAR 05/25/2021 0441   LABSPEC 1.038 (H) 05/25/2021 0441   PHURINE 6.0 05/25/2021 0441   GLUCOSEU >=500 (A) 05/25/2021 0441   HGBUR NEGATIVE 05/25/2021 0441   BILIRUBINUR NEGATIVE 05/25/2021 0441   KETONESUR NEGATIVE 05/25/2021 0441   PROTEINUR NEGATIVE 05/25/2021 0441   NITRITE NEGATIVE 05/25/2021 0441   LEUKOCYTESUR NEGATIVE  05/25/2021 0441    Radiological Exams on Admission: CT Angio Chest PE W and/or Wo Contrast  Result Date: 05/25/2021 CLINICAL DATA:  Shortness of breath, leukocytosis, COVID positive EXAM: CT ANGIOGRAPHY CHEST WITH CONTRAST TECHNIQUE: Multidetector CT imaging of the chest was performed using the standard protocol during bolus administration of intravenous contrast. Multiplanar CT image reconstructions and MIPs were obtained to evaluate the vascular anatomy. CONTRAST:  07/26/2021 OMNIPAQUE IOHEXOL 350 MG/ML SOLN COMPARISON:  Chest radiograph dated 05/24/2021 FINDINGS: Cardiovascular: Satisfactory opacification the bilateral pulmonary arteries to the segmental level. However, evaluation is also constrained by respiratory motion, particularly in the bilateral lower lobes. Within that constraint, there is no evidence of pulmonary embolism. Although not tailored for evaluation of the thoracic aorta, there is no evidence of thoracic aortic aneurysm or dissection. The heart is top-normal in size.  No pericardial effusion.  Three vessel coronary atherosclerosis. Mediastinum/Nodes: No suspicious mediastinal lymphadenopathy. Visualized thyroid is unremarkable. Lungs/Pleura: Evaluation is constrained by respiratory motion. Mild patchy opacities in the bilateral lower lobes, favoring atelectasis. Very mild subpleural ground-glass opacity in the upper lobes, possibly related to the patient's known COVID. No suspicious pulmonary nodules. No pleural effusion or pneumothorax. Upper Abdomen: Visualized upper abdomen is grossly unremarkable. Musculoskeletal: Degenerative changes of the visualized thoracolumbar spine. Review of the MIP images confirms the above findings. IMPRESSION: No evidence of pulmonary embolism. Very mild subpleural ground-glass opacity in the upper lobes, possibly related to the patient's known COVID. Mild dependent atelectasis in the bilateral lower lobes. Electronically Signed   By: Charline Bills M.D.   On:  05/25/2021 03:56   DG Chest Portable 1 View  Result Date: 05/24/2021 CLINICAL DATA:  Shortness of breath.  Cough. EXAM: PORTABLE CHEST 1 VIEW.  Patient is rotated. COMPARISON:  Chest x-ray 04/15/2021 FINDINGS: The heart size and mediastinal contours are within normal limits. Aortic calcifications. Left elevated hemidiaphragm. Left base atelectasis. No focal consolidation. No pulmonary edema. No pleural effusion. No pneumothorax. No acute osseous abnormality. IMPRESSION: No active disease. Electronically Signed   By: Tish Frederickson M.D.   On: 05/24/2021 22:53    EKG: Independently reviewed. Sinus tach, LBBB  Assessment/Plan COVID PNA     - admitted to obs, tele     - start remdes; antitussives     - O2 sats are good right now on RA, no steroids for now     - IS, flutter PRN     - follow inflammatory markers  N/V     - compazine     - N/V is improved this morning  Diarrhea     - he has not had a BM since yesterday; will hold off on GI testing for now     - fluids  Chronic combined HF Hx of HTN     - he's a little dry. Will get gentle fluids     - he was hypotensive last night; will hold home HF/HTN meds for right now and reassess for resuming later today  DM2     - hold home regimen     - add SSI, glucose checks, DM diet     - last A1c 6 weeks ago was 8.1  HLD     - continue statin  DVT prophylaxis: lovenox  Code Status: FULL  Family Communication: None at bedside  Consults called: None   Status is: Observation  The patient remains OBS appropriate and will d/c before 2 midnights.  Dispo: The patient is from: Home              Anticipated d/c is to: Home              Patient currently is not medically stable to d/c.   Difficult to place patient No  Time spent coordinating admission: 60 minutes  Sereena Marando A Danford Tat DO [facility]  If 7PM-7AM, please contact night-coverage [email or web address]  05/25/2021, 7:49 AM

## 2021-05-25 NOTE — ED Notes (Signed)
Patient is resting comfortably. 

## 2021-05-25 NOTE — ED Notes (Signed)
Patient is aware we need a urine sample. He still has not been able to provide one.

## 2021-05-25 NOTE — Care Management Obs Status (Signed)
MEDICARE OBSERVATION STATUS NOTIFICATION   Patient Details  Name: Micheal Hardin MRN: [id number] Date of Birth: [birth year]   Medicare Observation Status Notification Given:  Yes    Elliot Gault, LCSW 05/25/2021, 2:35 PM

## 2021-05-25 NOTE — Progress Notes (Signed)
Inpatient Diabetes Program Recommendations  AACE/ADA: New Consensus Statement on Inpatient Glycemic Control (2015)  Target Ranges:  Prepandial:   less than 140 mg/dL      Peak postprandial:   less than 180 mg/dL (1-2 hours)      Critically ill patients:  140 - 180 mg/dL   Lab Results  Component Value Date   GLUCAP 176 (H) 05/25/2021   HGBA1C 8.1 (H) 04/15/2021    Review of Glycemic Control  Diabetes history: DM2 Outpatient Diabetes medications: Jardiance 10 mg QD, metformin 500 mg BID Current orders for Inpatient glycemic control: Novolog 0-15 units TID with meals and 0-5 HS  HgbA1C - 8.1% CBGs 179, 175 mg/dL Still need PCP to manage his diabetes  Inpatient Diabetes Program Recommendations:    Agree with orders. Not receiving steroids at present.  If steroids added, add small amount of basal and meal coverage insulin.  Diabetes Coordinator spoke with pt on 04/17/21 about his SGLT2.  Will follow.  Thank you. Ailene Ards, RD, LDN, CDE Inpatient Diabetes Coordinator [phone]

## 2021-05-25 NOTE — ED Notes (Signed)
PA Mia made aware of pts low BP. PA at bedside.

## 2021-05-26 DIAGNOSIS — U071 COVID-19: Secondary | ICD-10-CM | POA: Diagnosis not present

## 2021-05-26 LAB — COMPREHENSIVE METABOLIC PANEL
ALT: 34 U/L (ref 0–44)
AST: 47 U/L — ABNORMAL HIGH (ref 15–41)
Albumin: 3.5 g/dL (ref 3.5–5.0)
Alkaline Phosphatase: 74 U/L (ref 38–126)
Anion gap: 10 (ref 5–15)
BUN: 19 mg/dL (ref 8–23)
CO2: 21 mmol/L — ABNORMAL LOW (ref 22–32)
Calcium: 8.3 mg/dL — ABNORMAL LOW (ref 8.9–10.3)
Chloride: 103 mmol/L (ref 98–111)
Creatinine, Ser: 0.94 mg/dL (ref 0.61–1.24)
GFR, Estimated: >60 mL/min (ref >60)
Glucose, Bld: 121 mg/dL — ABNORMAL HIGH (ref 70–99)
Potassium: 3.6 mmol/L (ref 3.5–5.1)
Sodium: 134 mmol/L — ABNORMAL LOW (ref 135–145)
Total Bilirubin: 1.4 mg/dL — ABNORMAL HIGH (ref 0.3–1.2)
Total Protein: 7.1 g/dL (ref 6.5–8.1)

## 2021-05-26 LAB — CBC WITH DIFFERENTIAL/PLATELET
Abs Immature Granulocytes: 0.02 10*3/uL (ref 0.00–0.07)
Basophils Absolute: 0 10*3/uL (ref 0.0–0.1)
Basophils Relative: 0 %
Eosinophils Absolute: 0 10*3/uL (ref 0.0–0.5)
Eosinophils Relative: 0 %
HCT: 42 % (ref 39.0–52.0)
Hemoglobin: 13.7 g/dL (ref 13.0–17.0)
Immature Granulocytes: 0 %
Lymphocytes Relative: 8 %
Lymphs Abs: 0.6 10*3/uL — ABNORMAL LOW (ref 0.7–4.0)
MCH: 29.5 pg (ref 26.0–34.0)
MCHC: 32.6 g/dL (ref 30.0–36.0)
MCV: 90.5 fL (ref 80.0–100.0)
Monocytes Absolute: 1.6 10*3/uL — ABNORMAL HIGH (ref 0.1–1.0)
Monocytes Relative: 21 %
Neutro Abs: 5.2 10*3/uL (ref 1.7–7.7)
Neutrophils Relative %: 71 %
Platelets: 161 10*3/uL (ref 150–400)
RBC: 4.64 MIL/uL (ref 4.22–5.81)
RDW: 14.3 % (ref 11.5–15.5)
WBC: 7.3 10*3/uL (ref 4.0–10.5)
nRBC: 0 % (ref 0.0–0.2)

## 2021-05-26 LAB — GLUCOSE, CAPILLARY
Glucose-Capillary: 119 mg/dL — ABNORMAL HIGH (ref 70–99)
Glucose-Capillary: 125 mg/dL — ABNORMAL HIGH (ref 70–99)

## 2021-05-26 LAB — D-DIMER, QUANTITATIVE: D-Dimer, Quant: 0.74 ug/mL-FEU — ABNORMAL HIGH (ref 0.00–0.50)

## 2021-05-26 LAB — C-REACTIVE PROTEIN: CRP: 7.7 mg/dL — ABNORMAL HIGH (ref <1.0)

## 2021-05-26 LAB — FERRITIN: Ferritin: 304 ng/mL (ref 24–336)

## 2021-05-26 MED ORDER — NIRMATRELVIR/RITONAVIR (PAXLOVID)TABLET: 3.0000 | ORAL_TABLET | Freq: Two times a day (BID) | ORAL | 0 refills | Status: AC

## 2021-05-26 NOTE — Discharge Summary (Signed)
Physician Discharge Summary  Micheal Hardin VOH:[id number] DOB: 10/20/1961 DOA: 05/24/2021  PCP: Patient, No Pcp Per (Inactive)  Admit date: 05/24/2021  Discharge date: 05/26/2021  Admitted From: Home.  Disposition:  Home.  Recommendations for Outpatient Follow-up:  Follow up with PCP in 1-2 weeks. Please obtain BMP/CBC in one week. Advised to remain in quarantine for one week. Advised to take paxloid three tablets twice daily for 5 days.  Home Health:None.  Equipment/Devices:None  Discharge Condition: Good CODE STATUS:Full code Diet recommendation: Heart Healthy   Brief Summary/ Hospital Course: This 60 years old male with PMH significant for type 2 diabetes, hypertension, combined CHF presented to the ED with complaints of nausea , vomiting, fever, chills and headache for few days.  He felt like he had severe flu symptoms that continued to worsen throughout the day.  Patient finally presented in the ED.  He is found to be COVID-positive.  CTA chest shows opacities in the upper lobe.  Pulmonary embolism was ruled out.  Patient was initially hypotensive required IV fluids.  Patient is admitted for COVID pneumonia.  He was not hypoxic, not requiring oxygen.  He did not have respiratory symptoms.  Given comorbid conditions patient was started on remdesivir.  Patient feels better next day, reports nausea and vomiting has resolved,  headache is significantly improved and he wants to be discharged.  Diarrhea has resolved.  Patient has received 2 doses of remdesivir.  Discussed with pharmacy and patient is being discharged home on paxloid 3 tablet twice a day for 5 days.  Patient feels better and patient is being discharged home.  Advised to remain in quarantine for 7 days.  Discharge Diagnoses:  Active Problems:   COVID-19  Discharge Instructions: Advised to take paxloid three tablet twice a day for 5 days.   Advised to remain in quarantine for 7 days.   Advised to follow-up with  PCP.  Discharge Instructions     Call MD for:  persistant dizziness or light-headedness   Complete by: As directed    Call MD for:  persistant nausea and vomiting   Complete by: As directed    Call MD for:  redness, tenderness, or signs of infection (pain, swelling, redness, odor or green/yellow discharge around incision site)   Complete by: As directed    Diet - low sodium heart healthy   Complete by: As directed    Diet Carb Modified   Complete by: As directed    Discharge instructions   Complete by: As directed    Advised to follow up PCP in one week. Advised to drink more fluids.  Advised to remain in quarantine for one week.   Increase activity slowly   Complete by: As directed       Allergies as of 05/26/2021   No Known Allergies      Medication List     TAKE these medications    Aspirin Low Dose 81 MG EC tablet Generic drug: aspirin Take 1 tablet (81 mg total) by mouth daily. Swallow whole.   atorvastatin 40 MG tablet Commonly known as: LIPITOR Take 1 tablet (40 mg total) by mouth daily.   carvedilol 6.25 MG tablet Commonly known as: COREG Take 1 tablet (6.25 mg total) by mouth 2 (two) times daily with a meal.   COUGH COLD/FLU RELIEF M PO Take 2 tablets by mouth every 6 (six) hours as needed (cough).   empagliflozin 10 MG Tabs tablet Commonly known as: JARDIANCE Take 1 tablet (10 mg total)  by mouth daily.   furosemide 40 MG tablet Commonly known as: LASIX Take 1 tablet (40 mg total) by mouth as needed. What changed: when to take this   metFORMIN 500 MG tablet Commonly known as: Glucophage Take 1 tablet (500 mg total) by mouth 2 (two) times daily with a meal.   nirmatrelvir/ritonavir EUA Tabs Commonly known as: PAXLOVID Take 3 tablets by mouth 2 (two) times daily for 5 days. Patient GFR is 60 Take nirmatrelvir (150 mg) two tablets twice daily for 5 days and ritonavir (100 mg) one tablet twice daily for 5 days.   nitroGLYCERIN 0.4 MG SL  tablet Commonly known as: NITROSTAT Place 0.4 mg under the tongue every 5 (five) minutes as needed for chest pain.   potassium chloride SA 20 MEQ tablet Commonly known as: KLOR-CON Take 1 tablet (20 mEq total) by mouth 2 (two) times daily for 2 days.   sacubitril-valsartan 24-26 MG Commonly known as: ENTRESTO Take 1 tablet by mouth 2 (two) times daily.   spironolactone 25 MG tablet Commonly known as: ALDACTONE Take 1/2 tablet (12.5 mg total) by mouth daily.        Follow-up Information     Maryland Pink, MD Follow up in 1 week(s).   Specialty: Family Medicine Contact information: [address] [place] [address] [phone]                No Known Allergies  Consultations: None   Procedures/Studies: CT Angio Chest PE W and/or Wo Contrast  Result Date: 05/25/2021 CLINICAL DATA:  Shortness of breath, leukocytosis, COVID positive EXAM: CT ANGIOGRAPHY CHEST WITH CONTRAST TECHNIQUE: Multidetector CT imaging of the chest was performed using the standard protocol during bolus administration of intravenous contrast. Multiplanar CT image reconstructions and MIPs were obtained to evaluate the vascular anatomy. CONTRAST:  OMNIPAQUE IOHEXOL 350 MG/ML SOLN COMPARISON:  Chest radiograph dated 05/24/2021 FINDINGS: Cardiovascular: Satisfactory opacification the bilateral pulmonary arteries to the segmental level. However, evaluation is also constrained by respiratory motion, particularly in the bilateral lower lobes. Within that constraint, there is no evidence of pulmonary embolism. Although not tailored for evaluation of the thoracic aorta, there is no evidence of thoracic aortic aneurysm or dissection. The heart is top-normal in size.  No pericardial effusion. Three vessel coronary atherosclerosis. Mediastinum/Nodes: No suspicious mediastinal lymphadenopathy. Visualized thyroid is unremarkable. Lungs/Pleura: Evaluation is constrained by respiratory motion. Mild  patchy opacities in the bilateral lower lobes, favoring atelectasis. Very mild subpleural ground-glass opacity in the upper lobes, possibly related to the patient's known COVID. No suspicious pulmonary nodules. No pleural effusion or pneumothorax. Upper Abdomen: Visualized upper abdomen is grossly unremarkable. Musculoskeletal: Degenerative changes of the visualized thoracolumbar spine. Review of the MIP images confirms the above findings. IMPRESSION: No evidence of pulmonary embolism. Very mild subpleural ground-glass opacity in the upper lobes, possibly related to the patient's known COVID. Mild dependent atelectasis in the bilateral lower lobes. Electronically Signed   By: Charline Bills M.D.   On: 05/25/2021 03:56   DG Chest Portable 1 View  Result Date: 05/24/2021 CLINICAL DATA:  Shortness of breath.  Cough. EXAM: PORTABLE CHEST 1 VIEW.  Patient is rotated. COMPARISON:  Chest x-ray 04/15/2021 FINDINGS: The heart size and mediastinal contours are within normal limits. Aortic calcifications. Left elevated hemidiaphragm. Left base atelectasis. No focal consolidation. No pulmonary edema. No pleural effusion. No pneumothorax. No acute osseous abnormality. IMPRESSION: No active disease. Electronically Signed   By: Tish Frederickson M.D.   On: 05/24/2021 22:53  Subjective: Patient was seen and examined at bedside.  Overnight events noted.  Patient reports feeling much improved,  nausea and vomiting and diarrhea has resolved.  He also reports improvement in headache.  He wants to be discharged  Discharge Exam: Vitals:   05/26/21 0128 05/26/21 0349  BP: 128/86 132/90  Pulse: 88 90  Resp:  18  Temp: 97.7 F (36.5 C) 97.8 F (36.6 C)  SpO2: 94% 98%   Vitals:   05/25/21 1722 05/25/21 2151 05/26/21 0128 05/26/21 0349  BP: 115/76 124/74 128/86 132/90  Pulse: 97 90 88 90  Resp: 18 18  18   Temp: 98.6 F (37 C) 98.1 F (36.7 C) 97.7 F (36.5 C) 97.8 F (36.6 C)  TempSrc: Oral Oral Oral Oral   SpO2: 98% 95% 94% 98%  Weight:      Height:        General: Pt is alert, awake, not in acute distress Cardiovascular: RRR, S1/S2 +, no rubs, no gallops Respiratory: CTA bilaterally, no wheezing, no rhonchi Abdominal: Soft, NT, ND, bowel sounds + Extremities: no edema, no cyanosis    The results of significant diagnostics from this hospitalization (including imaging, microbiology, ancillary and laboratory) are listed below for reference.     Microbiology: Recent Results (from the past 240 hour(s))  Resp Panel by RT-PCR (Flu A&B, Covid) Nasopharyngeal Swab     Status: Abnormal   Collection Time: 05/24/21 10:21 PM   Specimen: Nasopharyngeal Swab; Nasopharyngeal(NP) swabs in vial transport medium  Result Value Ref Range Status   SARS Coronavirus 2 by RT PCR POSITIVE (A) NEGATIVE Final    Comment: RESULT CALLED TO, READ BACK BY AND VERIFIED WITH: ANNA, RN @ 0107 ON 05/25/21 C VARNER (NOTE) SARS-CoV-2 target nucleic acids are DETECTED.  The SARS-CoV-2 RNA is generally detectable in upper respiratory specimens during the acute phase of infection. Positive results are indicative of the presence of the identified virus, but do not rule out bacterial infection or co-infection with other pathogens not detected by the test. Clinical correlation with patient history and other diagnostic information is necessary to determine patient infection status. The expected result is Negative.  Fact Sheet for Patients: 07/26/21  Fact Sheet for Healthcare Providers: BloggerCourse.com  This test is not yet approved or cleared by the [place] FDA and  has been authorized for detection and/or diagnosis of SARS-CoV-2 by FDA under an Emergency Use Authorization (EUA).  This EUA will remain in effect (meaning this test can b e used) for the duration of  the COVID-19 declaration under Section 564(b)(1) of the Act, 21 U.S.C. section  360bbb-3(b)(1), unless the authorization is terminated or revoked sooner.     Influenza A by PCR NEGATIVE NEGATIVE Final   Influenza B by PCR NEGATIVE NEGATIVE Final    Comment: (NOTE) The Xpert Xpress SARS-CoV-2/FLU/RSV plus assay is intended as an aid in the diagnosis of influenza from Nasopharyngeal swab specimens and should not be used as a sole basis for treatment. Nasal washings and aspirates are unacceptable for Xpert Xpress SARS-CoV-2/FLU/RSV testing.  Fact Sheet for Patients: [place]  Fact Sheet for Healthcare Providers: BloggerCourse.com  This test is not yet approved or cleared by the [place] FDA and has been authorized for detection and/or diagnosis of SARS-CoV-2 by FDA under an Emergency Use Authorization (EUA). This EUA will remain in effect (meaning this test can be used) for the duration of the COVID-19 declaration under Section 564(b)(1) of the Act, 21 U.S.C. section 360bbb-3(b)(1), unless the authorization is  terminated or revoked.  Performed at [facility], [address] [place], [place] [id number]      Labs: BNP (last 3 results) Recent Labs    11/16/20 1459 04/15/21 1426 04/25/21 1527  BNP 26.5 312.2* 31.2   Basic Metabolic Panel: Recent Labs  Lab 05/24/21 2101 05/25/21 1024 05/26/21 0303  NA 137 135 134*  K 3.7 4.0 3.6  CL 101 104 103  CO2 21* 21* 21*  GLUCOSE 249* 179* 121*  BUN 19 23 19   CREATININE 1.28* 1.24 0.94  CALCIUM 9.4 8.3* 8.3*   Liver Function Tests: Recent Labs  Lab 05/24/21 2101 05/25/21 1024 05/26/21 0303  AST 32 42* 47*  ALT 24 26 34  ALKPHOS 96 71 74  BILITOT 1.4* 1.1 1.4*  PROT 8.6* 7.6 7.1  ALBUMIN 4.6 3.9 3.5   Recent Labs  Lab 05/24/21 2101  LIPASE 43   No results for input(s): AMMONIA in the last 168 hours. CBC: Recent Labs  Lab 05/24/21 2101 05/25/21 1024 05/26/21 0303  WBC 15.0* 10.8* 7.3  NEUTROABS 13.1*  8.7* 5.2  HGB 14.6 13.3 13.7  HCT 43.8 40.0 42.0  MCV 90.1 90.5 90.5  PLT 213 201 161   Cardiac Enzymes: No results for input(s): CKTOTAL, CKMB, CKMBINDEX, TROPONINI in the last 168 hours. BNP: Invalid input(s): POCBNP CBG: Recent Labs  Lab 05/25/21 1238 05/25/21 1720 05/25/21 2149 05/26/21 0738 05/26/21 1132  GLUCAP 176* 121* 110* 119* 125*   D-Dimer Recent Labs    05/26/21 0303  DDIMER 0.74*   Hgb A1c No results for input(s): HGBA1C in the last 72 hours. Lipid Profile No results for input(s): CHOL, HDL, LDLCALC, TRIG, CHOLHDL, LDLDIRECT in the last 72 hours. Thyroid function studies No results for input(s): TSH, T4TOTAL, T3FREE, THYROIDAB in the last 72 hours.  Invalid input(s): FREET3 Anemia work up Recent Labs    05/26/21 0303  FERRITIN 304   Urinalysis    Component Value Date/Time   COLORURINE YELLOW 05/25/2021 0441   APPEARANCEUR CLEAR 05/25/2021 0441   LABSPEC 1.038 (H) 05/25/2021 0441   PHURINE 6.0 05/25/2021 0441   GLUCOSEU >=500 (A) 05/25/2021 0441   HGBUR NEGATIVE 05/25/2021 0441   BILIRUBINUR NEGATIVE 05/25/2021 0441   KETONESUR NEGATIVE 05/25/2021 0441   PROTEINUR NEGATIVE 05/25/2021 0441   NITRITE NEGATIVE 05/25/2021 0441   LEUKOCYTESUR NEGATIVE 05/25/2021 0441   Sepsis Labs Invalid input(s): PROCALCITONIN,  WBC,  LACTICIDVEN Microbiology Recent Results (from the past 240 hour(s))  Resp Panel by RT-PCR (Flu A&B, Covid) Nasopharyngeal Swab     Status: Abnormal   Collection Time: 05/24/21 10:21 PM   Specimen: Nasopharyngeal Swab; Nasopharyngeal(NP) swabs in vial transport medium  Result Value Ref Range Status   SARS Coronavirus 2 by RT PCR POSITIVE (A) NEGATIVE Final    Comment: RESULT CALLED TO, READ BACK BY AND VERIFIED WITH: ANNA, RN @ 0107 ON 05/25/21 C VARNER (NOTE) SARS-CoV-2 target nucleic acids are DETECTED.  The SARS-CoV-2 RNA is generally detectable in upper respiratory specimens during the acute phase of infection. Positive  results are indicative of the presence of the identified virus, but do not rule out bacterial infection or co-infection with other pathogens not detected by the test. Clinical correlation with patient history and other diagnostic information is necessary to determine patient infection status. The expected result is Negative.  Fact Sheet for Patients: BloggerCourse.comhttps://www.fda.gov/media/152166/download  Fact Sheet for Healthcare Providers: SeriousBroker.ithttps://www.fda.gov/media/152162/download  This test is not yet approved or cleared by the [place] FDA and  has been authorized for detection and/or diagnosis of SARS-CoV-2 by FDA under an Emergency Use Authorization (EUA).  This EUA will remain in effect (meaning this test can b e used) for the duration of  the COVID-19 declaration under Section 564(b)(1) of the Act, 21 U.S.C. section 360bbb-3(b)(1), unless the authorization is terminated or revoked sooner.     Influenza A by PCR NEGATIVE NEGATIVE Final   Influenza B by PCR NEGATIVE NEGATIVE Final    Comment: (NOTE) The Xpert Xpress SARS-CoV-2/FLU/RSV plus assay is intended as an aid in the diagnosis of influenza from Nasopharyngeal swab specimens and should not be used as a sole basis for treatment. Nasal washings and aspirates are unacceptable for Xpert Xpress SARS-CoV-2/FLU/RSV testing.  Fact Sheet for Patients: BloggerCourse.com  Fact Sheet for Healthcare Providers: SeriousBroker.it  This test is not yet approved or cleared by the [place] FDA and has been authorized for detection and/or diagnosis of SARS-CoV-2 by FDA under an Emergency Use Authorization (EUA). This EUA will remain in effect (meaning this test can be used) for the duration of the COVID-19 declaration under Section 564(b)(1) of the Act, 21 U.S.C. section 360bbb-3(b)(1), unless the authorization is terminated or revoked.  Performed at [facility],  [address], [place], [place] [address]      Time coordinating discharge: Over 30 minutes  SIGNED:   Cipriano Bunker, MD  [facility] 05/26/2021, 11:43 AM Pager   If 7PM-7AM, please contact night-coverage [email or web address]

## 2021-05-26 NOTE — Discharge Instructions (Signed)
Advised to remain in quarantine for one week.

## 2021-05-29 ENCOUNTER — Inpatient Hospital Stay: Payer: Medicare HMO | Admitting: Internal Medicine

## 2021-06-07 ENCOUNTER — Encounter (HOSPITAL_COMMUNITY): Payer: Medicare HMO

## 2021-06-30 ENCOUNTER — Other Ambulatory Visit (HOSPITAL_COMMUNITY): Payer: Self-pay | Admitting: Internal Medicine

## 2021-07-02 ENCOUNTER — Other Ambulatory Visit (HOSPITAL_COMMUNITY): Payer: Self-pay

## 2021-07-05 ENCOUNTER — Other Ambulatory Visit (HOSPITAL_COMMUNITY): Payer: Self-pay | Admitting: Internal Medicine

## 2021-07-16 ENCOUNTER — Other Ambulatory Visit (HOSPITAL_COMMUNITY): Payer: Self-pay

## 2021-07-27 ENCOUNTER — Other Ambulatory Visit (HOSPITAL_COMMUNITY): Payer: Self-pay | Admitting: Internal Medicine

## 2021-07-30 ENCOUNTER — Other Ambulatory Visit (HOSPITAL_COMMUNITY): Payer: Self-pay | Admitting: Internal Medicine

## 2021-07-30 ENCOUNTER — Telehealth (HOSPITAL_COMMUNITY): Payer: Self-pay

## 2021-07-30 NOTE — Telephone Encounter (Signed)
Called and left a detailed voice message of all the below information to confirm/remind patient of their appointment [facility] on [facility].   Patient reminded to bring all medications and/or complete list.

## 2021-07-31 ENCOUNTER — Encounter (HOSPITAL_COMMUNITY): Payer: Medicare HMO

## 2021-07-31 ENCOUNTER — Other Ambulatory Visit (HOSPITAL_COMMUNITY): Payer: Self-pay | Admitting: Internal Medicine

## 2021-08-14 ENCOUNTER — Other Ambulatory Visit (HOSPITAL_COMMUNITY): Payer: Self-pay

## 2021-08-14 MED ORDER — EMPAGLIFLOZIN 10 MG PO TABS: 10.0000 mg | ORAL_TABLET | Freq: Every day | ORAL | 0 refills | Status: DC

## 2021-08-20 ENCOUNTER — Other Ambulatory Visit: Payer: Self-pay

## 2021-08-20 ENCOUNTER — Ambulatory Visit (INDEPENDENT_AMBULATORY_CARE_PROVIDER_SITE_OTHER): Payer: Medicare HMO | Admitting: Family Medicine

## 2021-08-20 ENCOUNTER — Encounter: Payer: Self-pay | Admitting: Family Medicine

## 2021-08-20 VITALS — BP 135/92 | HR 97 | Ht 67.0 in | Wt 191.4 lb

## 2021-08-20 DIAGNOSIS — E1140 Type 2 diabetes mellitus with diabetic neuropathy, unspecified: Secondary | ICD-10-CM

## 2021-08-20 DIAGNOSIS — E119 Type 2 diabetes mellitus without complications: Secondary | ICD-10-CM | POA: Diagnosis not present

## 2021-08-20 DIAGNOSIS — R0683 Snoring: Secondary | ICD-10-CM | POA: Diagnosis not present

## 2021-08-20 LAB — POCT GLYCOSYLATED HEMOGLOBIN (HGB A1C): HbA1c, POC (controlled diabetic range): 9 % — AB (ref 0.0–7.0)
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
## 2021-08-20 NOTE — Patient Instructions (Signed)
It was great seeing you today.  I have no concerns at this time.  I do want to check your hemoglobin A1c to see if we need to start you back on metformin.  Otherwise I think you are doing well.  Be sure to call and reschedule your cardiology appointment.  I have placed a referral for a sleep study due to the concern for your sleep apnea.  If you have any questions or concerns feel free to call the clinic.  I hope you have a wonderful afternoon!

## 2021-08-21 DIAGNOSIS — E1140 Type 2 diabetes mellitus with diabetic neuropathy, unspecified: Secondary | ICD-10-CM | POA: Insufficient documentation

## 2021-08-21 DIAGNOSIS — R0683 Snoring: Secondary | ICD-10-CM | POA: Insufficient documentation

## 2021-08-21 MED ORDER — METFORMIN HCL ER 500 MG PO TB24: 500.0000 mg | ORAL_TABLET | Freq: Two times a day (BID) | ORAL | 3 refills | Status: DC

## 2021-08-21 NOTE — Assessment & Plan Note (Signed)
Patient has been told by multiple family members are concerned he has OSA.  He has never been evaluated for this.  Referral placed for pulmonology for evaluation for OSA and possible CPAP titration.

## 2021-08-21 NOTE — Assessment & Plan Note (Signed)
Hemoglobin A1c today 9.0.  Currently on Jardiance and no other medications.  Reports that he was on metformin in the past but did not like taking it.  Patient reports that he would be willing to restart the metformin if his A1c is uncontrolled.  Have sent prescription for metformin 500 mg twice daily to patient's pharmacy and he will continue take his Jardiance.  Follow-up in 3 months for repeat hemoglobin A1c, may titrate metformin up.

## 2021-09-14 ENCOUNTER — Other Ambulatory Visit: Payer: Self-pay

## 2021-09-14 ENCOUNTER — Emergency Department (HOSPITAL_COMMUNITY)
Admission: EM | Admit: 2021-09-14 | Discharge: 2021-09-15 | Disposition: A | Discharge status: Home / Self Care | Payer: Medicare HMO | Source: Home / Self Care | Attending: Emergency Medicine | Admitting: Emergency Medicine

## 2021-09-14 ENCOUNTER — Encounter (HOSPITAL_COMMUNITY): Payer: Self-pay

## 2021-09-14 ENCOUNTER — Emergency Department (HOSPITAL_COMMUNITY)
Admission: EM | Admit: 2021-09-14 | Discharge: 2021-09-14 | Disposition: A | Discharge status: Home / Self Care | Payer: Medicare HMO | Attending: Emergency Medicine | Admitting: Emergency Medicine

## 2021-09-14 ENCOUNTER — Emergency Department (HOSPITAL_COMMUNITY): Payer: Medicare HMO

## 2021-09-14 DIAGNOSIS — R195 Other fecal abnormalities: Secondary | ICD-10-CM | POA: Insufficient documentation

## 2021-09-14 DIAGNOSIS — E1140 Type 2 diabetes mellitus with diabetic neuropathy, unspecified: Secondary | ICD-10-CM | POA: Insufficient documentation

## 2021-09-14 DIAGNOSIS — R0602 Shortness of breath: Secondary | ICD-10-CM | POA: Diagnosis not present

## 2021-09-14 DIAGNOSIS — R52 Pain, unspecified: Secondary | ICD-10-CM

## 2021-09-14 DIAGNOSIS — Z7982 Long term (current) use of aspirin: Secondary | ICD-10-CM | POA: Insufficient documentation

## 2021-09-14 DIAGNOSIS — Z79899 Other long term (current) drug therapy: Secondary | ICD-10-CM | POA: Insufficient documentation

## 2021-09-14 DIAGNOSIS — Z7984 Long term (current) use of oral hypoglycemic drugs: Secondary | ICD-10-CM | POA: Insufficient documentation

## 2021-09-14 DIAGNOSIS — R112 Nausea with vomiting, unspecified: Secondary | ICD-10-CM | POA: Insufficient documentation

## 2021-09-14 DIAGNOSIS — I5023 Acute on chronic systolic (congestive) heart failure: Secondary | ICD-10-CM | POA: Insufficient documentation

## 2021-09-14 DIAGNOSIS — R6883 Chills (without fever): Secondary | ICD-10-CM | POA: Diagnosis not present

## 2021-09-14 DIAGNOSIS — R110 Nausea: Secondary | ICD-10-CM

## 2021-09-14 DIAGNOSIS — R61 Generalized hyperhidrosis: Secondary | ICD-10-CM | POA: Diagnosis not present

## 2021-09-14 DIAGNOSIS — R1084 Generalized abdominal pain: Secondary | ICD-10-CM

## 2021-09-14 DIAGNOSIS — Z8616 Personal history of COVID-19: Secondary | ICD-10-CM | POA: Insufficient documentation

## 2021-09-14 DIAGNOSIS — R079 Chest pain, unspecified: Secondary | ICD-10-CM | POA: Diagnosis not present

## 2021-09-14 DIAGNOSIS — Z5321 Procedure and treatment not carried out due to patient leaving prior to being seen by health care provider: Secondary | ICD-10-CM | POA: Insufficient documentation

## 2021-09-14 DIAGNOSIS — I110 Hypertensive heart disease with heart failure: Secondary | ICD-10-CM | POA: Insufficient documentation

## 2021-09-14 DIAGNOSIS — I509 Heart failure, unspecified: Secondary | ICD-10-CM | POA: Insufficient documentation

## 2021-09-14 LAB — CBC
HCT: 45.9 % (ref 39.0–52.0)
Hemoglobin: 15.6 g/dL (ref 13.0–17.0)
MCH: 31.1 pg (ref 26.0–34.0)
MCHC: 34 g/dL (ref 30.0–36.0)
MCV: 91.4 fL (ref 80.0–100.0)
Platelets: 240 10*3/uL (ref 150–400)
RBC: 5.02 MIL/uL (ref 4.22–5.81)
RDW: 13.1 % (ref 11.5–15.5)
WBC: 11.6 10*3/uL — ABNORMAL HIGH (ref 4.0–10.5)
nRBC: 0 % (ref 0.0–0.2)

## 2021-09-14 LAB — COMPREHENSIVE METABOLIC PANEL
ALT: 22 U/L (ref 0–44)
ALT: 23 U/L (ref 0–44)
AST: 30 U/L (ref 15–41)
AST: 34 U/L (ref 15–41)
Albumin: 4.5 g/dL (ref 3.5–5.0)
Albumin: 4.4 g/dL (ref 3.5–5.0)
Alkaline Phosphatase: 113 U/L (ref 38–126)
Alkaline Phosphatase: 111 U/L (ref 38–126)
Anion gap: 15 (ref 5–15)
Anion gap: 16 — ABNORMAL HIGH (ref 5–15)
BUN: 14 mg/dL (ref 8–23)
BUN: 11 mg/dL (ref 8–23)
CO2: 19 mmol/L — ABNORMAL LOW (ref 22–32)
CO2: 19 mmol/L — ABNORMAL LOW (ref 22–32)
Calcium: 9 mg/dL (ref 8.9–10.3)
Calcium: 9.4 mg/dL (ref 8.9–10.3)
Chloride: 101 mmol/L (ref 98–111)
Chloride: 99 mmol/L (ref 98–111)
Creatinine, Ser: 0.89 mg/dL (ref 0.61–1.24)
Creatinine, Ser: 0.98 mg/dL (ref 0.61–1.24)
GFR, Estimated: >60 mL/min (ref >60)
GFR, Estimated: >60 mL/min (ref >60)
Glucose, Bld: 312 mg/dL — ABNORMAL HIGH (ref 70–99)
Glucose, Bld: 289 mg/dL — ABNORMAL HIGH (ref 70–99)
Potassium: 3.9 mmol/L (ref 3.5–5.1)
Potassium: 3.9 mmol/L (ref 3.5–5.1)
Sodium: 135 mmol/L (ref 135–145)
Sodium: 134 mmol/L — ABNORMAL LOW (ref 135–145)
Total Bilirubin: 1 mg/dL (ref 0.3–1.2)
Total Bilirubin: 1.4 mg/dL — ABNORMAL HIGH (ref 0.3–1.2)
Total Protein: 8.9 g/dL — ABNORMAL HIGH (ref 6.5–8.1)
Total Protein: 8.4 g/dL — ABNORMAL HIGH (ref 6.5–8.1)

## 2021-09-14 LAB — CBC WITH DIFFERENTIAL/PLATELET
Abs Immature Granulocytes: 0.04 10*3/uL (ref 0.00–0.07)
Basophils Absolute: 0 10*3/uL (ref 0.0–0.1)
Basophils Relative: 0 %
Eosinophils Absolute: 0 10*3/uL (ref 0.0–0.5)
Eosinophils Relative: 0 %
HCT: 47.4 % (ref 39.0–52.0)
Hemoglobin: 15.9 g/dL (ref 13.0–17.0)
Immature Granulocytes: 1 %
Lymphocytes Relative: 10 %
Lymphs Abs: 0.8 10*3/uL (ref 0.7–4.0)
MCH: 31.1 pg (ref 26.0–34.0)
MCHC: 33.5 g/dL (ref 30.0–36.0)
MCV: 92.8 fL (ref 80.0–100.0)
Monocytes Absolute: 0.3 10*3/uL (ref 0.1–1.0)
Monocytes Relative: 4 %
Neutro Abs: 6.9 10*3/uL (ref 1.7–7.7)
Neutrophils Relative %: 85 %
Platelets: 227 10*3/uL (ref 150–400)
RBC: 5.11 MIL/uL (ref 4.22–5.81)
RDW: 13.2 % (ref 11.5–15.5)
WBC: 8.1 10*3/uL (ref 4.0–10.5)
nRBC: 0 % (ref 0.0–0.2)

## 2021-09-14 LAB — URINALYSIS, ROUTINE W REFLEX MICROSCOPIC
Bacteria, UA: NONE SEEN
Bilirubin Urine: NEGATIVE
Glucose, UA: >=500 mg/dL — AB
Ketones, ur: 20 mg/dL — AB
Leukocytes,Ua: NEGATIVE
Nitrite: NEGATIVE
Protein, ur: 100 mg/dL — AB
Specific Gravity, Urine: 1.026 (ref 1.005–1.030)
pH: 7 (ref 5.0–8.0)

## 2021-09-14 LAB — RAPID URINE DRUG SCREEN, HOSP PERFORMED
Amphetamines: NOT DETECTED
Barbiturates: NOT DETECTED
Benzodiazepines: NOT DETECTED
Cocaine: NOT DETECTED
Opiates: NOT DETECTED
Tetrahydrocannabinol: POSITIVE — AB

## 2021-09-14 LAB — LIPASE, BLOOD
Lipase: 78 U/L — ABNORMAL HIGH (ref 11–51)
Lipase: 50 U/L (ref 11–51)

## 2021-09-14 LAB — MAGNESIUM: Magnesium: 2 mg/dL (ref 1.7–2.4)

## 2021-09-14 LAB — TROPONIN I (HIGH SENSITIVITY)
Troponin I (High Sensitivity): 10 ng/L (ref <18)
Troponin I (High Sensitivity): 20 ng/L — ABNORMAL HIGH (ref <18)

## 2021-09-14 LAB — BRAIN NATRIURETIC PEPTIDE: B Natriuretic Peptide: 104.5 pg/mL — ABNORMAL HIGH (ref 0.0–100.0)

## 2021-09-14 MED ORDER — ONDANSETRON 4 MG PO TBDP
4.0000 mg | ORAL_TABLET | Freq: Once | ORAL | Status: AC
  Administered 2021-09-14: 4 mg via ORAL
  Filled 2021-09-14: qty 1

## 2021-09-14 NOTE — ED Provider Notes (Signed)
Emergency Medicine Provider Triage Evaluation Note  Micheal Hardin , a 60 y.o. male  was evaluated in triage.  Pt complains of nv, sweats, chills, chest pain, sob, abd pain that started just pta  Review of Systems  Positive: nv, sweats, chills, chest pain, sob, abd pain Negative: fever  Physical Exam  There were no vitals taken for this visit. Gen:   Awake, no distress   Resp:  Normal effort  MSK:   Moves extremities without difficulty  Other:  Dry heaving, diffuse abd ttp  Medical Decision Making  Medically screening exam initiated at 3:21 PM.  Appropriate orders placed.  Micheal Hardin was informed that the remainder of the evaluation will be completed by another provider, this initial triage assessment does not replace that evaluation, and the importance of remaining in the ED until their evaluation is complete.    Micheal Hardin 09/14/21 1521    Glendora Score, MD 09/14/21 1535

## 2021-09-14 NOTE — ED Triage Notes (Signed)
Patient c/o chest pain, SOB, and nausea x 4 hours. Patient reports a history of CHF.

## 2021-09-14 NOTE — ED Triage Notes (Signed)
Patient arrived with EMS from home reports pain across his abdomen with emesis today , hypertensive BP=184/106 , CBG= 306, he has not taken his medications today .

## 2021-09-15 ENCOUNTER — Emergency Department (HOSPITAL_COMMUNITY): Payer: Medicare HMO

## 2021-09-15 DIAGNOSIS — R079 Chest pain, unspecified: Secondary | ICD-10-CM | POA: Diagnosis not present

## 2021-09-15 LAB — TROPONIN I (HIGH SENSITIVITY): Troponin I (High Sensitivity): 20 ng/L — ABNORMAL HIGH (ref <18)

## 2021-09-15 MED ORDER — ONDANSETRON 4 MG PO TBDP: 4.0000 mg | ORAL_TABLET | Freq: Three times a day (TID) | ORAL | 0 refills | Status: DC | PRN

## 2021-09-15 MED ORDER — LABETALOL HCL 5 MG/ML IV SOLN: 20.0000 mg | Freq: Once | INTRAVENOUS | Status: DC

## 2021-09-15 MED ORDER — MORPHINE SULFATE (PF) 4 MG/ML IV SOLN
4.0000 mg | Freq: Once | INTRAVENOUS | Status: AC
  Administered 2021-09-15: 4 mg via INTRAVENOUS
  Filled 2021-09-15: qty 1

## 2021-09-15 MED ORDER — IOHEXOL 350 MG/ML SOLN
100.0000 mL | Freq: Once | INTRAVENOUS | Status: AC | PRN
  Administered 2021-09-15: 100 mL via INTRAVENOUS

## 2021-09-15 MED ORDER — ONDANSETRON HCL 4 MG/2ML IJ SOLN
4.0000 mg | Freq: Once | INTRAMUSCULAR | Status: AC
  Administered 2021-09-15: 4 mg via INTRAVENOUS
  Filled 2021-09-15: qty 2

## 2021-09-15 NOTE — ED Notes (Signed)
Pt returned from CT °

## 2021-09-15 NOTE — Discharge Instructions (Signed)
Return for any problem.  ?

## 2021-09-15 NOTE — ED Provider Notes (Signed)
Patient seen after prior EDP.  Patient reports that he feels significantly improved.  Work-up was without evidence of significant acute pathology.  Patient is taking good p.o.  He desires discharge home.  He does understand need for close follow-up.   Wynetta Fines, MD 09/15/21 1056

## 2021-09-15 NOTE — ED Notes (Signed)
Patient transported to CT 

## 2021-09-15 NOTE — ED Provider Notes (Signed)
[facility] EMERGENCY DEPARTMENT Provider Note   CSN: [id number] Arrival date & time: 09/14/21  2110     History Chief Complaint  Patient presents with   Abdominal Pain    Emesis     Micheal Hardin is a 60 y.o. male.  Patient presents to the emergency department for evaluation of abdominal pain.  Patient reports that he has had abdominal pain, nausea and dry heaves all day.  Symptoms began at 9 AM.  Patient reports that the pain is all over.  He did have a bowel movement this afternoon, reports that it was "dark".  Patient reports that he did have history of peptic ulcer disease in the past.  No hematemesis.  He reports that he had a similar problem approximately a year ago and had a thorough work-up but no etiology was found at that time.      Past Medical History:  Diagnosis Date   Arthritis    CHF (congestive heart failure) (HCC)    Diabetes mellitus without complication (HCC)    Hypertension     Patient Active Problem List   Diagnosis Date Noted   Type 2 diabetes mellitus with diabetic neuropathy, without long-term current use of insulin (HCC) 08/21/2021   Snoring 08/21/2021   COVID-19 05/25/2021   Acute on chronic systolic CHF (congestive heart failure) (HCC) 04/15/2021   Chest pain 04/15/2021   Diabetes mellitus type 2, uncontrolled 04/15/2021   Hypokalemia 04/15/2021   Intractable nausea and vomiting 05/08/2019    Past Surgical History:  Procedure Laterality Date   SPLENECTOMY, TOTAL     TONSILLECTOMY         Family History  Problem Relation Age of Onset   Cancer Mother    Diabetes Father     Social History   Tobacco Use   Smoking status: Never   Smokeless tobacco: Never  Vaping Use   Vaping Use: Never used  Substance Use Topics   Alcohol use: Never   Drug use: Yes    Types: Marijuana    Home Medications Prior to Admission medications   Medication Sig Start Date End Date Taking? Authorizing Provider  aspirin 81 MG EC  tablet Take 1 tablet (81 mg total) by mouth daily. Swallow whole. 04/18/21  Yes Ghimire, Werner Lean, MD  atorvastatin (LIPITOR) 40 MG tablet Take 1 tablet by mouth once daily Patient taking differently: Take 40 mg by mouth daily. 07/30/21  Yes Angelita Ingles, MD  carvedilol (COREG) 6.25 MG tablet TAKE 1 TABLET BY MOUTH TWICE DAILY WITH A MEAL Patient taking differently: Take 6.25 mg by mouth 2 (two) times daily with a meal. 07/30/21  Yes Winfrey, Kimberlee Nearing, MD  empagliflozin (JARDIANCE) 10 MG TABS tablet Take 1 tablet (10 mg total) by mouth daily. 08/14/21  Yes Winfrey, Kimberlee Nearing, MD  ENTRESTO 24-26 MG Take 1 tablet by mouth twice daily Patient taking differently: Take 1 tablet by mouth 2 (two) times daily. 07/30/21  Yes Angelita Ingles, MD  metFORMIN (GLUCOPHAGE-XR) 500 MG 24 hr tablet Take 1 tablet (500 mg total) by mouth 2 (two) times daily. 08/21/21  Yes Derrel Nip, MD  nitroGLYCERIN (NITROSTAT) 0.4 MG SL tablet Place 0.4 mg under the tongue every 5 (five) minutes as needed for chest pain.   Yes [provider]  OVER THE COUNTER MEDICATION Take 2 tablets by mouth every 4 (four) hours as needed (cold/cough). Equate brand medication for cough and cold   Yes [provider]  spironolactone (  ALDACTONE) 25 MG tablet Take 1/2 tablet (12.5 mg total) by mouth daily. 05/17/21  Yes Angelita Ingles, MD  furosemide (LASIX) 40 MG tablet Take 1 tablet (40 mg total) by mouth as needed. Patient taking differently: Take 40 mg by mouth daily. 05/17/21   Angelita Ingles, MD  potassium chloride SA (KLOR-CON) 20 MEQ tablet Take 1 tablet (20 mEq total) by mouth 2 (two) times daily for 2 days. Patient not taking: Reported on 09/15/2021 05/09/21 05/11/21  Angelita Ingles, MD    Allergies    Patient has no known allergies.  Review of Systems   Review of Systems  Gastrointestinal:  Positive for abdominal pain, nausea and vomiting.  All other systems reviewed and are  negative.  Physical Exam Updated Vital Signs BP (!) 185/112 (BP Location: Right Arm)   Pulse (!) 117   Temp 98 F (36.7 C) (Oral)   Resp 20   SpO2 100%   Physical Exam Vitals and nursing note reviewed.  Constitutional:      General: He is not in acute distress.    Appearance: Normal appearance. He is well-developed.  HENT:     Head: Normocephalic and atraumatic.     Right Ear: Hearing normal.     Left Ear: Hearing normal.     Nose: Nose normal.  Eyes:     Conjunctiva/sclera: Conjunctivae normal.     Pupils: Pupils are equal, round, and reactive to light.  Cardiovascular:     Rate and Rhythm: Regular rhythm.     Heart sounds: S1 normal and S2 normal. No murmur heard.   No friction rub. No gallop.  Pulmonary:     Effort: Pulmonary effort is normal. No respiratory distress.     Breath sounds: Normal breath sounds.  Chest:     Chest wall: No tenderness.  Abdominal:     General: Bowel sounds are normal.     Palpations: Abdomen is soft.     Tenderness: There is generalized abdominal tenderness. There is no guarding or rebound. Negative signs include Murphy's sign and McBurney's sign.     Hernia: No hernia is present.  Musculoskeletal:        General: Normal range of motion.     Cervical back: Normal range of motion and neck supple.  Skin:    General: Skin is warm and dry.     Findings: No rash.  Neurological:     Mental Status: He is alert and oriented to person, place, and time.     GCS: GCS eye subscore is 4. GCS verbal subscore is 5. GCS motor subscore is 6.     Cranial Nerves: No cranial nerve deficit.     Sensory: No sensory deficit.     Coordination: Coordination normal.  Psychiatric:        Speech: Speech normal.        Behavior: Behavior normal.        Thought Content: Thought content normal.    ED Results / Procedures / Treatments   Labs (all labs ordered are listed, but only abnormal results are displayed) Labs Reviewed  COMPREHENSIVE METABOLIC PANEL -  Abnormal; Notable for the following components:      Result Value   Sodium 134 (*)    CO2 19 (*)    Glucose, Bld 289 (*)    Total Protein 8.4 (*)    Total Bilirubin 1.4 (*)    Anion gap 16 (*)    All other components within normal limits  CBC - Abnormal; Notable for the following components:   WBC 11.6 (*)    All other components within normal limits  URINALYSIS, ROUTINE W REFLEX MICROSCOPIC - Abnormal; Notable for the following components:   Glucose, UA >=500 (*)    Hgb urine dipstick MODERATE (*)    Ketones, ur 20 (*)    Protein, ur 100 (*)    All other components within normal limits  RAPID URINE DRUG SCREEN, HOSP PERFORMED - Abnormal; Notable for the following components:   Tetrahydrocannabinol POSITIVE (*)    All other components within normal limits  TROPONIN I (HIGH SENSITIVITY) - Abnormal; Notable for the following components:   Troponin I (High Sensitivity) 20 (*)    All other components within normal limits  TROPONIN I (HIGH SENSITIVITY) - Abnormal; Notable for the following components:   Troponin I (High Sensitivity) 20 (*)    All other components within normal limits  LIPASE, BLOOD  MAGNESIUM  LACTIC ACID, PLASMA  BRAIN NATRIURETIC PEPTIDE  POC OCCULT BLOOD, ED    EKG EKG Interpretation  Date/Time:  Friday September 14 2021 21:30:19 EDT Ventricular Rate:  118 PR Interval:  142 QRS Duration: 118 QT Interval:  364 QTC Calculation: 510 R Axis:   -55 Text Interpretation: Sinus tachycardia Pulmonary disease pattern Left anterior fascicular block Left ventricular hypertrophy with QRS widening and repolarization abnormality ( R in aVL , Cornell product ) Cannot rule out Septal infarct , age undetermined Abnormal ECG No significant change since last tracing Confirmed by Gilda Crease [phone]) on 09/15/2021 3:20:38 AM  Radiology DG Chest 2 View  Result Date: 09/14/2021 CLINICAL DATA:  Chest pain EXAM: CHEST - 2 VIEW COMPARISON:  Chest x-ray 05/24/2021  FINDINGS: Heart size is upper normal. Mediastinum appears stable. Calcified plaques in the aortic arch. Pulmonary vasculature within normal limits. No focal consolidation visualized. Minimal likely subsegmental atelectasis at the left lung base. No pleural effusion or pneumothorax visualized. IMPRESSION: No acute process visualized. Electronically Signed   By: Jannifer Hick M.D.   On: 09/14/2021 16:10    Procedures Procedures   Medications Ordered in ED Medications  ondansetron (ZOFRAN) injection 4 mg (4 mg Intravenous Given 09/15/21 0552)  morphine 4 MG/ML injection 4 mg (4 mg Intravenous Given 09/15/21 4854)    ED Course  I have reviewed the triage vital signs and the nursing notes.  Pertinent labs & imaging results that were available during my care of the patient were reviewed by me and considered in my medical decision making (see chart for details).    MDM Rules/Calculators/A&P                           Patient presents to the emergency department for evaluation of abdominal pain with nausea and vomiting.  He reports a similar presentation in the recent past.  Records indicate an admission for intractable vomiting 2 years ago without clear etiology.  At that time it was felt he either had gastroenteritis or gastroparesis.  Patient indicates that he has had some loose stools that were dark in nature.  A rectal exam, however, revealed no stool to Hemoccult test.  Patient noted to be in mildly tachycardic, EKG indicates sinus tachycardia.  Records indicate that he has an ejection fraction of 20 to 25%, with holding IV fluids at this time.  Patient administered analgesia.  Patient indicating diffuse abdominal pain, exam without signs of guarding or rebound.  Will perform CT angiography of  abdomen and pelvis to further evaluate.  Signout oncoming ER physician to follow-up on results.  Final Clinical Impression(s) / ED Diagnoses Final diagnoses:  Generalized abdominal pain    Rx /  DC Orders ED Discharge Orders     None        Melondy Blanchard, Canary Brim, MD 09/15/21 [phone]

## 2021-09-18 ENCOUNTER — Other Ambulatory Visit: Payer: Self-pay

## 2021-09-18 ENCOUNTER — Encounter: Payer: Self-pay | Admitting: Podiatry

## 2021-09-18 ENCOUNTER — Ambulatory Visit (INDEPENDENT_AMBULATORY_CARE_PROVIDER_SITE_OTHER): Payer: Medicare HMO | Admitting: Podiatry

## 2021-09-18 DIAGNOSIS — B351 Tinea unguium: Secondary | ICD-10-CM | POA: Insufficient documentation

## 2021-09-18 DIAGNOSIS — E1140 Type 2 diabetes mellitus with diabetic neuropathy, unspecified: Secondary | ICD-10-CM

## 2021-09-18 DIAGNOSIS — M79674 Pain in right toe(s): Secondary | ICD-10-CM | POA: Diagnosis not present

## 2021-09-18 DIAGNOSIS — M79675 Pain in left toe(s): Secondary | ICD-10-CM | POA: Diagnosis not present

## 2021-09-18 NOTE — Progress Notes (Signed)
This patient presents  to my office for at risk foot care.  This patient requires this care by a professional since this patient will be at risk due to having type 2 diabetes.  This patient is unable to cut nails himself since the patient cannot reach his nails.These nails are painful walking and wearing shoes.  This patient presents for at risk foot care today.  General Appearance  Alert, conversant and in no acute stress.  Vascular  Dorsalis pedis and posterior tibial  pulses are palpable  bilaterally.  Capillary return is within normal limits  bilaterally. Temperature is within normal limits  bilaterally.  Neurologic  Senn-Weinstein monofilament wire test diminished/absent  bilaterally. Muscle power within normal limits bilaterally.  Nails Thick disfigured discolored nails with subungual debris  hallux nails bilaterally. No evidence of bacterial infection or drainage bilaterally.  Orthopedic  No limitations of motion  feet .  No crepitus or effusions noted.  No bony pathology or digital deformities noted.  HAV  B/L.  Hallux malleus  IPJ right foot.  Skin  normotropic skin with no porokeratosis noted bilaterally.  No signs of infections or ulcers noted.     Onychomycosis  Pain in right toes  Pain in left toes  Consent was obtained for treatment procedures.   Mechanical debridement of nails 1-5  bilaterally performed with a nail nipper.  Filed with dremel without incident. Patient qualifies for diabetic shoes due to DPN<  HAV B/L and hallux malleus.   Return office visit  3 months                    Told patient to return for periodic foot care and evaluation due to potential at risk complications.   Helane Gunther DPM

## 2021-09-21 ENCOUNTER — Other Ambulatory Visit: Payer: Medicare HMO

## 2021-11-27 ENCOUNTER — Telehealth: Payer: Medicare HMO | Admitting: Nurse Practitioner

## 2021-11-27 DIAGNOSIS — J22 Unspecified acute lower respiratory infection: Secondary | ICD-10-CM

## 2021-11-27 DIAGNOSIS — J0140 Acute pansinusitis, unspecified: Secondary | ICD-10-CM | POA: Diagnosis not present

## 2021-11-27 DIAGNOSIS — J40 Bronchitis, not specified as acute or chronic: Secondary | ICD-10-CM

## 2021-11-27 MED ORDER — DOXYCYCLINE HYCLATE 100 MG PO TABS: 100.0000 mg | ORAL_TABLET | Freq: Two times a day (BID) | ORAL | 0 refills | Status: AC

## 2021-11-27 NOTE — Progress Notes (Signed)
We are sorry that you are not feeling well.  Here is how we plan to help!  Based on your presentation I believe you most likely have A cough due to bacteria.  When patients have a fever and a productive cough with a change in color or increased sputum production, we are concerned about bacterial bronchitis.  If left untreated it can progress to pneumonia.  If your symptoms do not improve with your treatment plan it is important that you contact your provider.   I have prescribed Doxycycline 100 mg twice a day for 10 days     In addition you may use A non-prescription cough medication called Mucinex: take 2 tablets every 12 hours.  We cannot prescribe any narcotics or controlled substances within eVisits.  This includes cough medications that contain codeine. If you feel that you need these, a face to face with a provider is required.    From your responses in the eVisit questionnaire you describe inflammation in the upper respiratory tract which is causing a significant cough.  This is commonly called Bronchitis and has four common causes:   Allergies Viral Infections Acid Reflux Bacterial Infection Allergies, viruses and acid reflux are treated by controlling symptoms or eliminating the cause. An example might be a cough caused by taking certain blood pressure medications. You stop the cough by changing the medication. Another example might be a cough caused by acid reflux. Controlling the reflux helps control the cough.     HOME CARE Only take medications as instructed by your medical team. Complete the entire course of an antibiotic. Drink plenty of fluids and get plenty of rest. Avoid close contacts especially the very young and the elderly Cover your mouth if you cough or cough into your sleeve. Always remember to wash your hands A steam or ultrasonic humidifier can help congestion.   GET HELP RIGHT AWAY IF: You develop worsening fever. You become short of breath You cough up  blood. Your symptoms persist after you have completed your treatment plan MAKE SURE YOU  Understand these instructions. Will watch your condition. Will get help right away if you are not doing well or get worse.    Thank you for choosing an e-visit.  Your e-visit answers were reviewed by a board certified advanced clinical practitioner to complete your personal care plan. Depending upon the condition, your plan could have included both over the counter or prescription medications.  Please review your pharmacy choice. Make sure the pharmacy is open so you can pick up prescription now. If there is a problem, you may contact your provider through [employer] and have the prescription routed to another pharmacy.  Your safety is important to [place]. If you have drug allergies check your prescription carefully.   For the next 24 hours you can use MyChart to ask questions about today's visit, request a non-urgent call back, or ask for a work or school excuse. You will get an email in the next two days asking about your experience. I hope that your e-visit has been valuable and will speed your recovery.   I spent approximately 7 minutes reviewing the patient's history, current symptoms and coordinating their plan of care today.    Meds ordered this encounter  Medications   doxycycline (VIBRA-TABS) 100 MG tablet    Sig: Take 1 tablet (100 mg total) by mouth 2 (two) times daily for 10 days.    Dispense:  20 tablet    Refill:  0

## 2021-11-28 ENCOUNTER — Other Ambulatory Visit: Payer: Self-pay

## 2021-12-10 ENCOUNTER — Telehealth: Payer: Medicare HMO | Admitting: Physician Assistant

## 2021-12-10 DIAGNOSIS — R051 Acute cough: Secondary | ICD-10-CM

## 2021-12-10 NOTE — Progress Notes (Signed)
Based on what you shared with me, I feel your condition warrants further evaluation and I recommend that you be seen in a face to face visit.  Being that you were recently treated on 11/27/21, and would have just completed the antibiotic treatment have still not improved or are having a recurrence of symptoms you should be evaluated in person to rule out pneumonia or other causes of your symptoms, like a heart failure exacerbation.    NOTE: There will be NO CHARGE for this eVisit   If you are having a true medical emergency please call 911.      For an urgent face to face visit, [facility] has six urgent care centers for your convenience:     [facility] Health Urgent [facility] Center at [facility] Directions [phone] [address] [place], [place] [address]    [facility] Health Urgent Care Center [facility]) Get Driving Directions [phone] [address] [place], [place] [address]  [facility] Health Urgent Care Center [facility]) Get Driving Directions [phone] [address] [place],  [place]  [address]  [facility] Health Urgent Care at [facility] Get Driving Directions [phone] [address] [place], [place] [address]   [facility] Health Urgent Care at [facility] Get Driving Directions  [phone] [address] [place], [place] [address]   [facility] Health Urgent Care at [facility] Directions [phone] [address] [place], [place] [address]  Your MyChart E-visit questionnaire answers were reviewed by a board certified advanced clinical practitioner to complete your personal care plan based on your specific symptoms.  Thank you for using e-Visits.    I provided 5 minutes of non face-to-face time during this encounter for chart review and documentation.

## 2021-12-12 ENCOUNTER — Ambulatory Visit: Payer: Medicare HMO

## 2021-12-12 NOTE — Patient Instructions (Incomplete)
It was nice seeing you today!  Blood work today.  See me in 3 months or whenever is a good for you.  Stay well, Taheera Thomann, MD [facility] [phone]  --  Make sure to check out at the front desk before you leave today.  Please arrive at least 15 minutes prior to your scheduled appointments.  If you had blood work today, I will send you a MyChart message or a letter if results are normal. Otherwise, I will give you a call.  If you had a referral placed, they will call you to set up an appointment. Please give us a call if you don't hear back in the next 2 weeks.  If you need additional refills before your next appointment, please call your pharmacy first.  

## 2021-12-12 NOTE — Progress Notes (Deleted)
° ° °  SUBJECTIVE:   CHIEF COMPLAINT / HPI:  No chief complaint on file.   Patient had an ED visit 2 weeks ago on 1/10, felt to have bacterial bronchitis and prescribed doxycycline for 10 days.  Also advised to use Mucinex.  He had a negative COVID test on 1/23.  PERTINENT  PMH / PSH: CHF, T2DM  Patient Care Team: Derrel Nip, MD as PCP - General (Family Medicine)   OBJECTIVE:   There were no vitals taken for this visit.  Physical Exam   Depression screen PHQ 2/9 08/20/2021  Decreased Interest 2  Down, Depressed, Hopeless 3  PHQ - 2 Score 5  Altered sleeping 3  Tired, decreased energy 3  Change in appetite 3  Feeling bad or failure about yourself  2  Trouble concentrating 2  Moving slowly or fidgety/restless 0  Suicidal thoughts 0  PHQ-9 Score 18  Difficult doing work/chores Somewhat difficult     {Show previous vital signs (optional):23777}  {Labs   Heme   Chem   Endocrine   Serology   Results Review (optional):23779}  ASSESSMENT/PLAN:   No problem-specific Assessment & Plan notes found for this encounter.    No follow-ups on file.   Littie Deeds, MD [facility]

## 2021-12-13 ENCOUNTER — Other Ambulatory Visit: Payer: Self-pay

## 2021-12-13 ENCOUNTER — Ambulatory Visit (INDEPENDENT_AMBULATORY_CARE_PROVIDER_SITE_OTHER): Payer: Medicare HMO | Admitting: Family Medicine

## 2021-12-13 VITALS — BP 136/80 | HR 102 | Wt 187.8 lb

## 2021-12-13 DIAGNOSIS — E1140 Type 2 diabetes mellitus with diabetic neuropathy, unspecified: Secondary | ICD-10-CM | POA: Diagnosis not present

## 2021-12-13 DIAGNOSIS — J069 Acute upper respiratory infection, unspecified: Secondary | ICD-10-CM

## 2021-12-13 LAB — POCT GLYCOSYLATED HEMOGLOBIN (HGB A1C): HbA1c, POC (controlled diabetic range): 8.7 % — AB (ref 0.0–7.0)

## 2021-12-13 MED ORDER — METFORMIN HCL ER 500 MG PO TB24: 1000.0000 mg | ORAL_TABLET | Freq: Two times a day (BID) | ORAL | 3 refills | Status: AC

## 2021-12-13 NOTE — Assessment & Plan Note (Signed)
A1c today 8.7%, currently on Metformin 500mg  BID and Jardiance 10mg  daily. Patient is not taking any insulin. - Increase Metformin to 1000mg  BID - Repeat A1c in 3 months

## 2021-12-13 NOTE — Progress Notes (Signed)
° ° °  SUBJECTIVE:   CHIEF COMPLAINT / HPI:   Patient presents for follow-up of his bronchitis that was diagnosed earlier this month. He reports that he has continued to have an intermittent cough and fatigue. He overall feels that his cough has improved somewhat, but he is still having issues with fatigue. He has a history of heart failure but no recent abnormal leg swelling (though he feels some fullness of his feet). No chest pain or difficulty with breathing at this time.   Patient is also due for an A1c and his current medications include Metformin 500mg  BID and Jardiance 10mg  daily.   PERTINENT  PMH / PSH: Reviewed  OBJECTIVE:   BP 136/80    Pulse (!) 102    Wt 187 lb 12.8 oz (85.2 kg)    SpO2 98%    BMI 30.31 kg/m   Gen: well-appearing, NAD CV: RRR, no m/r/g appreciated, no peripheral edema Pulm: CTAB, no wheezes/crackles GI: soft, non-tender, non-distended  ASSESSMENT/PLAN:   Type 2 diabetes mellitus with diabetic neuropathy, without long-term current use of insulin (HCC) A1c today 8.7%, currently on Metformin 500mg  BID and Jardiance 10mg  daily. Patient is not taking any insulin. - Increase Metformin to 1000mg  BID - Repeat A1c in 3 months   Recent URI Patient continues to have a cough present with recent illness diagnosed earlier this month. Physical exam reassuring without significant lung findings. No concern at this time for PNA or fluid overload (history of CHF). Return precautions discussed. - If persistent cough >2 months, consider CXR  Tachycardia Patient reports his HR is typically elevated in the 90s-100s, has been over 100 on the last several visits. Patient is asymptomatic and has no concerns at this time, will continue to monitor.   , DO [place] [facility]

## 2021-12-13 NOTE — Patient Instructions (Signed)
Your lungs sound good today, if you cough continues for 2 months after your illness then please let me know and we will consider if we need to get imaging of your chest.    For you diabetes, your A1c was elevated to 8.7, we are going to increase your metformin dose to 1000mg  twice daily. You can start with taking 500mg  in the morning and 1000mg  at night for a few days and then do 1000mg  twice daily afterwards as long as you can tolerate it. You will need to follow-up in 3 months to check in with your sugars.

## 2021-12-19 ENCOUNTER — Ambulatory Visit: Payer: Medicare HMO | Admitting: Podiatry

## 2021-12-20 ENCOUNTER — Institutional Professional Consult (permissible substitution): Payer: Medicare HMO | Admitting: Pulmonary Disease

## 2021-12-23 ENCOUNTER — Encounter (HOSPITAL_COMMUNITY): Payer: Self-pay | Admitting: *Deleted

## 2021-12-23 ENCOUNTER — Inpatient Hospital Stay (HOSPITAL_COMMUNITY)
Admission: EM | Admit: 2021-12-23 | Discharge: 2021-12-25 | DRG: 378 | Disposition: A | Discharge status: Home / Self Care | Payer: Medicare Other | Attending: Family Medicine | Admitting: Family Medicine

## 2021-12-23 ENCOUNTER — Other Ambulatory Visit: Payer: Self-pay

## 2021-12-23 ENCOUNTER — Emergency Department (HOSPITAL_COMMUNITY): Payer: Medicare Other

## 2021-12-23 DIAGNOSIS — Z833 Family history of diabetes mellitus: Secondary | ICD-10-CM

## 2021-12-23 DIAGNOSIS — Z9081 Acquired absence of spleen: Secondary | ICD-10-CM

## 2021-12-23 DIAGNOSIS — E119 Type 2 diabetes mellitus without complications: Secondary | ICD-10-CM | POA: Diagnosis present

## 2021-12-23 DIAGNOSIS — I959 Hypotension, unspecified: Secondary | ICD-10-CM | POA: Diagnosis present

## 2021-12-23 DIAGNOSIS — K649 Unspecified hemorrhoids: Secondary | ICD-10-CM | POA: Diagnosis not present

## 2021-12-23 DIAGNOSIS — Z8711 Personal history of peptic ulcer disease: Secondary | ICD-10-CM

## 2021-12-23 DIAGNOSIS — I5022 Chronic systolic (congestive) heart failure: Secondary | ICD-10-CM | POA: Diagnosis not present

## 2021-12-23 DIAGNOSIS — I110 Hypertensive heart disease with heart failure: Secondary | ICD-10-CM | POA: Diagnosis not present

## 2021-12-23 DIAGNOSIS — M1990 Unspecified osteoarthritis, unspecified site: Secondary | ICD-10-CM | POA: Diagnosis not present

## 2021-12-23 DIAGNOSIS — D62 Acute posthemorrhagic anemia: Secondary | ICD-10-CM | POA: Diagnosis present

## 2021-12-23 DIAGNOSIS — N179 Acute kidney failure, unspecified: Secondary | ICD-10-CM | POA: Diagnosis not present

## 2021-12-23 DIAGNOSIS — Z20822 Contact with and (suspected) exposure to covid-19: Secondary | ICD-10-CM | POA: Diagnosis present

## 2021-12-23 DIAGNOSIS — Z79899 Other long term (current) drug therapy: Secondary | ICD-10-CM | POA: Diagnosis not present

## 2021-12-23 DIAGNOSIS — Z7984 Long term (current) use of oral hypoglycemic drugs: Secondary | ICD-10-CM

## 2021-12-23 DIAGNOSIS — K921 Melena: Secondary | ICD-10-CM | POA: Diagnosis not present

## 2021-12-23 DIAGNOSIS — Z7982 Long term (current) use of aspirin: Secondary | ICD-10-CM

## 2021-12-23 DIAGNOSIS — K922 Gastrointestinal hemorrhage, unspecified: Secondary | ICD-10-CM | POA: Diagnosis present

## 2021-12-23 LAB — COMPREHENSIVE METABOLIC PANEL
ALT: 17 U/L (ref 0–44)
AST: 20 U/L (ref 15–41)
Albumin: 3.2 g/dL — ABNORMAL LOW (ref 3.5–5.0)
Alkaline Phosphatase: 82 U/L (ref 38–126)
Anion gap: 7 (ref 5–15)
BUN: 26 mg/dL — ABNORMAL HIGH (ref 8–23)
CO2: 19 mmol/L — ABNORMAL LOW (ref 22–32)
Calcium: 8.1 mg/dL — ABNORMAL LOW (ref 8.9–10.3)
Chloride: 106 mmol/L (ref 98–111)
Creatinine, Ser: 1.4 mg/dL — ABNORMAL HIGH (ref 0.61–1.24)
GFR, Estimated: 57 mL/min — ABNORMAL LOW (ref >60)
Glucose, Bld: 265 mg/dL — ABNORMAL HIGH (ref 70–99)
Potassium: 3.9 mmol/L (ref 3.5–5.1)
Sodium: 132 mmol/L — ABNORMAL LOW (ref 135–145)
Total Bilirubin: 0.5 mg/dL (ref 0.3–1.2)
Total Protein: 6.4 g/dL — ABNORMAL LOW (ref 6.5–8.1)

## 2021-12-23 LAB — CBC
HCT: 35.3 % — ABNORMAL LOW (ref 39.0–52.0)
HCT: 34.4 % — ABNORMAL LOW (ref 39.0–52.0)
HCT: 30.4 % — ABNORMAL LOW (ref 39.0–52.0)
Hemoglobin: 11.5 g/dL — ABNORMAL LOW (ref 13.0–17.0)
Hemoglobin: 11 g/dL — ABNORMAL LOW (ref 13.0–17.0)
Hemoglobin: 9.9 g/dL — ABNORMAL LOW (ref 13.0–17.0)
MCH: 30.7 pg (ref 26.0–34.0)
MCH: 30.7 pg (ref 26.0–34.0)
MCH: 30.9 pg (ref 26.0–34.0)
MCHC: 32.6 g/dL (ref 30.0–36.0)
MCHC: 32 g/dL (ref 30.0–36.0)
MCHC: 32.6 g/dL (ref 30.0–36.0)
MCV: 94.1 fL (ref 80.0–100.0)
MCV: 96.1 fL (ref 80.0–100.0)
MCV: 95 fL (ref 80.0–100.0)
Platelets: 228 10*3/uL (ref 150–400)
Platelets: 203 10*3/uL (ref 150–400)
Platelets: 176 10*3/uL (ref 150–400)
RBC: 3.75 MIL/uL — ABNORMAL LOW (ref 4.22–5.81)
RBC: 3.58 MIL/uL — ABNORMAL LOW (ref 4.22–5.81)
RBC: 3.2 MIL/uL — ABNORMAL LOW (ref 4.22–5.81)
RDW: 13.3 % (ref 11.5–15.5)
RDW: 13.6 % (ref 11.5–15.5)
RDW: 13.6 % (ref 11.5–15.5)
WBC: 9.4 10*3/uL (ref 4.0–10.5)
WBC: 9.6 10*3/uL (ref 4.0–10.5)
WBC: 8.4 10*3/uL (ref 4.0–10.5)
nRBC: 0 % (ref 0.0–0.2)
nRBC: 0 % (ref 0.0–0.2)
nRBC: 0 % (ref 0.0–0.2)

## 2021-12-23 LAB — I-STAT CHEM 8, ED
BUN: 24 mg/dL — ABNORMAL HIGH (ref 8–23)
BUN: 26 mg/dL — ABNORMAL HIGH (ref 8–23)
Calcium, Ion: 1.15 mmol/L (ref 1.15–1.40)
Calcium, Ion: 1.16 mmol/L (ref 1.15–1.40)
Chloride: 105 mmol/L (ref 98–111)
Chloride: 106 mmol/L (ref 98–111)
Creatinine, Ser: 1.3 mg/dL — ABNORMAL HIGH (ref 0.61–1.24)
Creatinine, Ser: 1.2 mg/dL (ref 0.61–1.24)
Glucose, Bld: 261 mg/dL — ABNORMAL HIGH (ref 70–99)
Glucose, Bld: 219 mg/dL — ABNORMAL HIGH (ref 70–99)
HCT: 35 % — ABNORMAL LOW (ref 39.0–52.0)
HCT: 35 % — ABNORMAL LOW (ref 39.0–52.0)
Hemoglobin: 11.9 g/dL — ABNORMAL LOW (ref 13.0–17.0)
Hemoglobin: 11.9 g/dL — ABNORMAL LOW (ref 13.0–17.0)
Potassium: 4.1 mmol/L (ref 3.5–5.1)
Potassium: 4.6 mmol/L (ref 3.5–5.1)
Sodium: 137 mmol/L (ref 135–145)
Sodium: 138 mmol/L (ref 135–145)
TCO2: 20 mmol/L — ABNORMAL LOW (ref 22–32)
TCO2: 22 mmol/L (ref 22–32)

## 2021-12-23 LAB — TYPE AND SCREEN
ABO/RH(D): A NEG
Antibody Screen: NEGATIVE

## 2021-12-23 LAB — LACTIC ACID, PLASMA
Lactic Acid, Venous: 1.6 mmol/L (ref 0.5–1.9)
Lactic Acid, Venous: 1.7 mmol/L (ref 0.5–1.9)

## 2021-12-23 LAB — RESP PANEL BY RT-PCR (FLU A&B, COVID) ARPGX2
Influenza A by PCR: NEGATIVE
Influenza B by PCR: NEGATIVE
SARS Coronavirus 2 by RT PCR: NEGATIVE

## 2021-12-23 LAB — GLUCOSE, CAPILLARY
Glucose-Capillary: 121 mg/dL — ABNORMAL HIGH (ref 70–99)
Glucose-Capillary: 188 mg/dL — ABNORMAL HIGH (ref 70–99)
Glucose-Capillary: 224 mg/dL — ABNORMAL HIGH (ref 70–99)

## 2021-12-23 LAB — ABO/RH: ABO/RH(D): A NEG

## 2021-12-23 LAB — MRSA NEXT GEN BY PCR, NASAL: MRSA by PCR Next Gen: NOT DETECTED

## 2021-12-23 MED ORDER — SODIUM CHLORIDE 0.9 % IV BOLUS
500.0000 mL | Freq: Once | INTRAVENOUS | Status: AC
  Administered 2021-12-23: 500 mL via INTRAVENOUS

## 2021-12-23 MED ORDER — IOHEXOL 300 MG/ML  SOLN: 100.0000 mL | Freq: Once | INTRAMUSCULAR | Status: DC | PRN

## 2021-12-23 MED ORDER — INFLUENZA VAC SPLIT QUAD 0.5 ML IM SUSY: 0.5000 mL | PREFILLED_SYRINGE | INTRAMUSCULAR | Status: DC

## 2021-12-23 MED ORDER — POLYETHYLENE GLYCOL 3350 17 G PO PACK: 17.0000 g | PACK | Freq: Every day | ORAL | Status: DC | PRN

## 2021-12-23 MED ORDER — SODIUM CHLORIDE (PF) 0.9 % IJ SOLN
INTRAMUSCULAR | Status: AC
  Filled 2021-12-23: qty 50

## 2021-12-23 MED ORDER — DOCUSATE SODIUM 100 MG PO CAPS: 100.0000 mg | ORAL_CAPSULE | Freq: Two times a day (BID) | ORAL | Status: DC | PRN

## 2021-12-23 MED ORDER — INSULIN ASPART 100 UNIT/ML IJ SOLN
0.0000 [IU] | INTRAMUSCULAR | Status: DC
  Administered 2021-12-23: 3 [IU] via SUBCUTANEOUS
  Administered 2021-12-23: 2 [IU] via SUBCUTANEOUS
  Administered 2021-12-24 (×5): 1 [IU] via SUBCUTANEOUS
  Filled 2021-12-23: qty 0.09

## 2021-12-23 MED ORDER — PANTOPRAZOLE SODIUM 40 MG IV SOLR
40.0000 mg | Freq: Two times a day (BID) | INTRAVENOUS | Status: DC
  Administered 2021-12-23 – 2021-12-24 (×4): 40 mg via INTRAVENOUS
  Filled 2021-12-23 (×4): qty 40

## 2021-12-23 MED ORDER — IOHEXOL 350 MG/ML SOLN
100.0000 mL | Freq: Once | INTRAVENOUS | Status: AC | PRN
  Administered 2021-12-23: 100 mL via INTRAVENOUS

## 2021-12-23 MED ORDER — CHLORHEXIDINE GLUCONATE CLOTH 2 % EX PADS
6.0000 | MEDICATED_PAD | Freq: Every day | CUTANEOUS | Status: DC
  Administered 2021-12-23 – 2021-12-24 (×2): 6 via TOPICAL

## 2021-12-23 NOTE — ED Notes (Signed)
Dr. Fredderick Phenix aware of trending BP. Will administer additional NS bolus per MD Belfi.

## 2021-12-23 NOTE — Progress Notes (Addendum)
eLink Physician-Brief Progress Note Patient Name: Juniper Snyders DOB: 01-07-1961 MRN: [id number]   Date of Service  12/23/2021  HPI/Events of Note  Sign out mentions to follow CBC every 4 hours. Noted last level around 4 pm. Next check at 8 pm. Order is in Epic.   eICU Interventions  Reminded RN to let us know when lab results     Intervention Category Major Interventions: Hemorrhage - evaluation and management  Matyas Baisley G Dequane Strahan 12/23/2021, 7:30 PM  Addendum at 9:55 pm  Hemoglobin noted at 9.9, no bleeding reported Ordered serial CBC to start at 2 am. Also ordered a BMP for 2 am LA checked has been normal  Addendum at 4:45 am Called and spoke with RN about patient Patient had one episode of rectal bleeding around 930 pm but nothing since then and has been hemodynamically stable Hemoglobin 9.7 on last check  RN also notes he has some hemorrhoids, do not see that mentioned in GI note  Has only had some clears. Will order low dose LR to infuse . Next CBC at 8 am. I was also notified that nuclear scans cannot happen overnight at this site. Of course given no active bleeding at this time, do not think he needs one just as yet. Call E link if needed, d/w RN

## 2021-12-23 NOTE — H&P (Addendum)
NAME:  Micheal Hardin, MRN:  [id number], DOB:  11/04/1961, LOS: 0 ADMISSION DATE:  12/23/2021, CONSULTATION DATE:  12/23/21 REFERRING MD:  Rolan Bucco, MD CHIEF COMPLAINT:  GI Bleed   History of Present Illness:  Micheal Hardin is a 61 year old male with HFrEF 25-30%, DMII and hypertension who presented to the ER 2/5 with rectal bleeding that started overnight. He reports having abdominal pain followed by dark maroon bowel movements. Then bowel movements were then noted to be bright red blood.  He reports 4-5 liquid bowel movements.  Per EMS, his BP was 92/60. He was given 1L of NS in the ER with improvement in his blood pressure. Hgb 11.5g/dL with baseline around 01.0U/VO. His blood pressure began to soften again prompting consult to PCCM.   Patient describes general abdominal discomfort that is greatest on the left.  He denies any fevers chills, nausea or vomiting.  He had been feeling his normal self until last evening.  He denies any significant NSAID usage or alcohol use.  He reports a remote history of peptic ulcer disease 15 to 20 years ago.  Pertinent  Medical History   Past Medical History:  Diagnosis Date   Arthritis    CHF (congestive heart failure) (HCC)    Diabetes mellitus without complication (HCC)    Hypertension     Significant Hospital Events: Including procedures, antibiotic start and stop dates in addition to other pertinent events   2/5 admitted to ICU for GI Bleeding   CTA Abdomen;Pelvis 2/5 IMPRESSION: VASCULAR   1. Scattered atherosclerotic plaque within normal caliber abdominal aorta, not resulting in hemodynamically significant stenosis. 2. No discrete areas of intraluminal contrast extravasation to suggest the etiology of reported history of GI bleeding.   NON-VASCULAR   1. 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 2. Dystrophic calcifications within a borderline enlarged  prostate. If not recently performed, further evaluation with DRE is advised.  Interim History / Subjective:    Objective   Blood pressure 103/70, pulse 99, temperature 98.1 F (36.7 C), temperature source Oral, resp. rate 16, height 5\' 6"  (1.676 m), weight 81.6 kg, SpO2 100 %.        Intake/Output Summary (Last 24 hours) at 12/23/2021 1347 Last data filed at 12/23/2021 1151 Gross per 24 hour  Intake 500 ml  Output --  Net 500 ml   Filed Weights   12/23/21 0932  Weight: 81.6 kg    Examination: General: Elderly male, no acute distress HENT: [place]/AT, moist mucous membranes, sclera anicteric Lungs: Clear to auscultation bilaterally.  No wheezing or rhonchi. Cardiovascular: Regular rate and rhythm, no murmurs Abdomen: Soft, nontender, nondistended, decreased bowel sounds on the left. Extremities: Cool to touch, no edema Neuro: Alert and oriented x3, moving all extremities GU: Deferred  Resolved Hospital Problem list     Assessment & Plan:  Acute GI Bleed Acute Blood Loss Anemia -CTA abdomen from previous ER visit 09/15/21 with no mention of diverticulosis. CTA Abdomen today without overt signs of bleeding or intestinal inflammation. -Start pantoprazole IV 40 mg twice daily -Every 6 hour CBCs -Place 18g IV x 2 - GI has been consulted - Check lactic acid - Type screen completed, patient consented to blood products if needed  Hypotension - given 1L in the ER - Bolus as needed  HFrEF 25-30% - Continue to monitor - hold carvedilol, entresto and spironolactone  Acute Kidney Injury - fluid resuscitation as above - monitor Cr - avoid  nephrotoxic agents  DM II - hold home metformin and jardiance - SSI   Best Practice (right click and "Reselect all SmartList Selections" daily)   Diet/type: NPO w/ oral meds DVT prophylaxis: SCD GI prophylaxis: PPI Lines: N/A Foley:  N/A Code Status:  full code Last date of multidisciplinary goals of care discussion [discussed with  patient 2/5 in ER]  Labs   CBC: Recent Labs  Lab 12/23/21 0937 12/23/21 0951 12/23/21 1323  WBC 9.4  --   --   HGB 11.5* 11.9* 11.9*  HCT 35.3* 35.0* 35.0*  MCV 94.1  --   --   PLT 228  --   --     Basic Metabolic Panel: Recent Labs  Lab 12/23/21 0937 12/23/21 0951 12/23/21 1323  NA 132* 137 138  K 3.9 4.1 4.6  CL 106 105 106  CO2 19*  --   --   GLUCOSE 265* 261* 219*  BUN 26* 24* 26*  CREATININE 1.40* 1.30* 1.20  CALCIUM 8.1*  --   --    GFR: Estimated Creatinine Clearance: 64 mL/min (by C-G formula based on SCr of 1.2 mg/dL). Recent Labs  Lab 12/23/21 0937  WBC 9.4    Liver Function Tests: Recent Labs  Lab 12/23/21 0937  AST 20  ALT 17  ALKPHOS 82  BILITOT 0.5  PROT 6.4*  ALBUMIN 3.2*   No results for input(s): LIPASE, AMYLASE in the last 168 hours. No results for input(s): AMMONIA in the last 168 hours.  ABG    Component Value Date/Time   TCO2 22 12/23/2021 1323     Coagulation Profile: No results for input(s): INR, PROTIME in the last 168 hours.  Cardiac Enzymes: No results for input(s): CKTOTAL, CKMB, CKMBINDEX, TROPONINI in the last 168 hours.  HbA1C: HbA1c, POC (controlled diabetic range)  Date/Time Value Ref Range Status  12/13/2021 03:28 PM 8.7 (A) 0.0 - 7.0 % Final  08/20/2021 04:47 PM 9.0 (A) 0.0 - 7.0 % Final    CBG: No results for input(s): GLUCAP in the last 168 hours.  Review of Systems:   Review of Systems  Constitutional:  Negative for chills, fever, malaise/fatigue and weight loss.  HENT:  Negative for congestion, sinus pain and sore throat.   Eyes: Negative.   Respiratory:  Negative for cough, hemoptysis, sputum production, shortness of breath and wheezing.   Cardiovascular:  Negative for chest pain, palpitations, orthopnea, claudication and leg swelling.  Gastrointestinal:  Positive for abdominal pain, blood in stool and melena. Negative for heartburn, nausea and vomiting.  Genitourinary: Negative.    Musculoskeletal:  Negative for joint pain and myalgias.  Skin:  Negative for rash.  Neurological:  Negative for weakness.  Endo/Heme/Allergies: Negative.   Psychiatric/Behavioral: Negative.      Past Medical History:  He,  has a past medical history of Arthritis, CHF (congestive heart failure) (HCC), Diabetes mellitus without complication (HCC), and Hypertension.   Surgical History:   Past Surgical History:  Procedure Laterality Date   SPLENECTOMY, TOTAL     TONSILLECTOMY       Social History:   reports that he has never smoked. He has never used smokeless tobacco. He reports current drug use. Drug: Marijuana. He reports that he does not drink alcohol.   Family History:  His family history includes Cancer in his mother; Diabetes in his father.   Allergies No Known Allergies   Home Medications  Prior to Admission medications   Medication Sig Start Date End Date Taking? Authorizing  Provider  aspirin 81 MG EC tablet Take 1 tablet (81 mg total) by mouth daily. Swallow whole. 04/18/21  Yes Ghimire, Werner Lean, MD  atorvastatin (LIPITOR) 40 MG tablet Take 1 tablet by mouth once daily Patient taking differently: Take 40 mg by mouth daily. 07/30/21  Yes Angelita Ingles, MD  carvedilol (COREG) 6.25 MG tablet TAKE 1 TABLET BY MOUTH TWICE DAILY WITH A MEAL Patient taking differently: Take 6.25 mg by mouth 2 (two) times daily with a meal. 07/30/21  Yes Winfrey, Kimberlee Nearing, MD  empagliflozin (JARDIANCE) 10 MG TABS tablet Take 1 tablet (10 mg total) by mouth daily. 08/14/21  Yes Winfrey, Kimberlee Nearing, MD  ENTRESTO 24-26 MG Take 1 tablet by mouth twice daily Patient taking differently: Take 1 tablet by mouth 2 (two) times daily. 07/30/21  Yes Angelita Ingles, MD  furosemide (LASIX) 40 MG tablet Take 1 tablet (40 mg total) by mouth as needed. Patient taking differently: Take 40 mg by mouth daily. 05/17/21  Yes Angelita Ingles, MD  metFORMIN (GLUCOPHAGE-XR) 500 MG 24 hr tablet Take 2 tablets  (1,000 mg total) by mouth 2 (two) times daily. 12/13/21  Yes Lilland, Alana, DO  nitroGLYCERIN (NITROSTAT) 0.4 MG SL tablet Place 0.4 mg under the tongue every 5 (five) minutes as needed for chest pain.   Yes [provider]  spironolactone (ALDACTONE) 25 MG tablet Take 1/2 tablet (12.5 mg total) by mouth daily. 05/17/21  Yes Angelita Ingles, MD  ondansetron (ZOFRAN ODT) 4 MG disintegrating tablet Take 1 tablet (4 mg total) by mouth every 8 (eight) hours as needed for nausea or vomiting. Patient not taking: Reported on 12/23/2021 09/15/21   Wynetta Fines, MD  potassium chloride SA (KLOR-CON) 20 MEQ tablet Take 1 tablet (20 mEq total) by mouth 2 (two) times daily for 2 days. Patient not taking: Reported on 09/15/2021 05/09/21 05/11/21  Angelita Ingles, MD     Critical care time: 43 minutes    Melody Comas, MD [facility] Office: [phone]   See Amion for personal pager PCCM on call pager [phone] until 7pm. Please call Elink 7p-7a. [phone]

## 2021-12-23 NOTE — ED Provider Notes (Signed)
[facility] DEPT Provider Note   CSN: [id number] Arrival date & time: 12/23/21  0907     History  Chief Complaint  Patient presents with   GI Bleeding    Micheal Hardin is a 61 y.o. male.  Patient is a 61 year old male who presents with blood in his stools.  He says it started during the night last night.  He has had about 4 episodes of first maroon-colored and then bright red blood per rectum.  He also has some associated left-sided abdominal pain.  He has had some nausea but no vomiting.  He is feeling a bit dizzy and lightheaded.  Per EMS, his blood pressure was 92/60.  He got 100 cc of normal saline in route.  He denies any history of GI bleeding in the past.  He says he has had some peptic ulcer disease but denies any vomiting.      Home Medications Prior to Admission medications   Medication Sig Start Date End Date Taking? Authorizing Provider  aspirin 81 MG EC tablet Take 1 tablet (81 mg total) by mouth daily. Swallow whole. 04/18/21  Yes Ghimire, Henreitta Leber, MD  atorvastatin (LIPITOR) 40 MG tablet Take 1 tablet by mouth once daily Patient taking differently: Take 40 mg by mouth daily. 07/30/21  Yes Katherine Roan, MD  carvedilol (COREG) 6.25 MG tablet TAKE 1 TABLET BY MOUTH TWICE DAILY WITH A MEAL Patient taking differently: Take 6.25 mg by mouth 2 (two) times daily with a meal. 07/30/21  Yes Winfrey, Jenne Pane, MD  empagliflozin (JARDIANCE) 10 MG TABS tablet Take 1 tablet (10 mg total) by mouth daily. 08/14/21  Yes Winfrey, Jenne Pane, MD  ENTRESTO 24-26 MG Take 1 tablet by mouth twice daily Patient taking differently: Take 1 tablet by mouth 2 (two) times daily. 07/30/21  Yes Katherine Roan, MD  furosemide (LASIX) 40 MG tablet Take 1 tablet (40 mg total) by mouth as needed. Patient taking differently: Take 40 mg by mouth daily. 05/17/21  Yes Katherine Roan, MD  metFORMIN (GLUCOPHAGE-XR) 500 MG 24 hr tablet Take 2 tablets (1,000 mg total)  by mouth 2 (two) times daily. 12/13/21  Yes Lilland, Alana, DO  nitroGLYCERIN (NITROSTAT) 0.4 MG SL tablet Place 0.4 mg under the tongue every 5 (five) minutes as needed for chest pain.   Yes [provider]  spironolactone (ALDACTONE) 25 MG tablet Take 1/2 tablet (12.5 mg total) by mouth daily. 05/17/21  Yes Katherine Roan, MD  ondansetron (ZOFRAN ODT) 4 MG disintegrating tablet Take 1 tablet (4 mg total) by mouth every 8 (eight) hours as needed for nausea or vomiting. Patient not taking: Reported on 12/23/2021 09/15/21   Valarie Merino, MD  potassium chloride SA (KLOR-CON) 20 MEQ tablet Take 1 tablet (20 mEq total) by mouth 2 (two) times daily for 2 days. Patient not taking: Reported on 09/15/2021 05/09/21 05/11/21  Katherine Roan, MD      Allergies    Patient has no known allergies.    Review of Systems   Review of Systems  Constitutional:  Positive for fatigue. Negative for chills, diaphoresis and fever.  HENT:  Negative for congestion, rhinorrhea and sneezing.   Eyes: Negative.   Respiratory:  Negative for cough, chest tightness and shortness of breath.   Cardiovascular:  Negative for chest pain and leg swelling.  Gastrointestinal:  Positive for abdominal pain, blood in stool and nausea. Negative for diarrhea and vomiting.  Genitourinary:  Negative for  difficulty urinating, flank pain, frequency and hematuria.  Musculoskeletal:  Negative for arthralgias and back pain.  Skin:  Negative for rash.  Neurological:  Positive for light-headedness. Negative for dizziness, speech difficulty, weakness, numbness and headaches.   Physical Exam Updated Vital Signs BP (!) 93/53    Pulse 94    Temp 98.1 F (36.7 C) (Oral)    Resp 17    Ht 5\' 6"  (1.676 m)    Wt 81.6 kg    SpO2 99%    BMI 29.05 kg/m  Physical Exam Constitutional:      Appearance: He is well-developed.  HENT:     Head: Normocephalic and atraumatic.  Eyes:     Pupils: Pupils are equal, round, and reactive to light.   Cardiovascular:     Rate and Rhythm: Regular rhythm. Tachycardia present.     Heart sounds: Normal heart sounds.  Pulmonary:     Effort: Pulmonary effort is normal. No respiratory distress.     Breath sounds: Normal breath sounds. No wheezing or rales.  Chest:     Chest wall: No tenderness.  Abdominal:     General: Bowel sounds are normal.     Palpations: Abdomen is soft.     Tenderness: There is abdominal tenderness (Left lower quadrant). There is no guarding or rebound.  Genitourinary:    Comments: Gross blood noted on rectal exam Musculoskeletal:        General: Normal range of motion.     Cervical back: Normal range of motion and neck supple.  Lymphadenopathy:     Cervical: No cervical adenopathy.  Skin:    General: Skin is warm and dry.     Findings: No rash.  Neurological:     Mental Status: He is alert and oriented to person, place, and time.    ED Results / Procedures / Treatments   Labs (all labs ordered are listed, but only abnormal results are displayed) Labs Reviewed  COMPREHENSIVE METABOLIC PANEL - Abnormal; Notable for the following components:      Result Value   Sodium 132 (*)    CO2 19 (*)    Glucose, Bld 265 (*)    BUN 26 (*)    Creatinine, Ser 1.40 (*)    Calcium 8.1 (*)    Total Protein 6.4 (*)    Albumin 3.2 (*)    GFR, Estimated 57 (*)    All other components within normal limits  CBC - Abnormal; Notable for the following components:   RBC 3.75 (*)    Hemoglobin 11.5 (*)    HCT 35.3 (*)    All other components within normal limits  I-STAT CHEM 8, ED - Abnormal; Notable for the following components:   BUN 24 (*)    Creatinine, Ser 1.30 (*)    Glucose, Bld 261 (*)    TCO2 20 (*)    Hemoglobin 11.9 (*)    HCT 35.0 (*)    All other components within normal limits  I-STAT CHEM 8, ED - Abnormal; Notable for the following components:   BUN 26 (*)    Glucose, Bld 219 (*)    Hemoglobin 11.9 (*)    HCT 35.0 (*)    All other components within  normal limits  RESP PANEL BY RT-PCR (FLU A&B, COVID) ARPGX2  LACTIC ACID, PLASMA  LACTIC ACID, PLASMA  CBC  CBC  TYPE AND SCREEN  ABO/RH    EKG EKG Interpretation  Date/Time:  Sunday December 23 2021 09:27:38 EST  Ventricular Rate:  103 PR Interval:  150 QRS Duration: 128 QT Interval:  385 QTC Calculation: 504 R Axis:   -45 Text Interpretation: Sinus tachycardia LVH with IVCD, LAD and secondary repol abnrm Prolonged QT interval since last tracing no significant change Confirmed by Malvin Johns [id number]) on 12/23/2021 11:42:31 AM  Radiology CT ANGIO GI BLEED  Result Date: 12/23/2021 CLINICAL DATA:  Left lower quadrant abdominal pain. Evaluate for GI bleeding. EXAM: CTA ABDOMEN AND PELVIS WITHOUT AND WITH CONTRAST TECHNIQUE: Multidetector CT imaging of the abdomen and pelvis was performed using the standard protocol during bolus administration of intravenous contrast. Multiplanar reconstructed images and MIPs were obtained and reviewed to evaluate the vascular anatomy. RADIATION DOSE REDUCTION: This exam was performed according to the departmental dose-optimization program which includes automated exposure control, adjustment of the mA and/or kV according to patient size and/or use of iterative reconstruction technique. CONTRAST:  140mL OMNIPAQUE IOHEXOL 350 MG/ML SOLN COMPARISON:  CT abdomen pelvis-09/15/2021; CT abdomen pelvis-05/07/2019; chest CT-05/25/2021 FINDINGS: VASCULAR Aorta: Minimal amount of atherosclerotic plaque within normal caliber abdominal aorta, not resulting in hemodynamically significant stenosis. No evidence of abdominal aortic dissection or perivascular stranding. Celiac: Widely patent without hemodynamically significant narrowing. Conventional branching pattern, though the splenic artery is expectedly atrophic in the setting of previous splenectomy. SMA: Widely patent without hemodynamically significant narrowing. Conventional branching pattern. The distal tributaries of  the SMA appear widely patent without discrete intraluminal filling defect to suggest distal embolism. Renals: Solitary bilaterally; both renal arteries are widely patent without hemodynamically significant narrowing. No vessel irregularity to suggest FMD. IMA: Widely patent without hemodynamically significant narrowing. Inflow: There is a minimal amount of calcified atherosclerotic plaque involving the bilateral normal caliber common iliac arteries, not resulting in hemodynamically significant stenosis. The bilateral external iliac arteries are of normal caliber and while mildly tortuous are without a hemodynamically significant stenosis. The bilateral internal iliac arteries are mildly diseased though patent of normal caliber. Proximal Outflow: The bilateral common and imaged portions of the bilateral deep and superficial femoral arteries are of normal caliber and widely patent without a hemodynamically significant stenosis. Veins: The IVC and pelvic venous systems are widely patent without hemodynamically significant narrowing. Review of the MIP images confirms the above findings. _________________________________________________________ NON-VASCULAR Lower chest: Limited visualization of the lower thorax demonstrates minimal dependent subpleural ground-glass atelectasis. No discrete focal airspace opacities. Normal heart size. Coronary artery calcifications. Calcifications involving the mitral valve annulus. Decreased attenuation of the intra cardiac blood pool suggestive of anemia. Normal hepatic contour. Hepatobiliary: Normal hepatic contour. 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 No discrete hepatic lesions. Normal appearance of the gallbladder given degree distention. No radiopaque gallstones. No intra or extrahepatic biliary duct dilatation. No ascites. Pancreas: Normal appearance of the pancreas. Spleen: Surgically absent though redemonstrated several ectopic splenules with dominant splenule within the right upper abdominal quadrant adjacent to the posteromedial aspect the  right lobe of the liver measuring approximately 2.4 x 1.3 cm, dominant splenule within the ventral aspect of the right mid abdomen measuring 4.2 x 2.1 cm (image 43, series 13 and dominant splenule within the right lower abdominal quadrant measuring 4.4 x 2.9 cm (image 54, series 19). Adrenals/Urinary Tract: There is symmetric enhancement of the bilateral kidneys. No evidence of nephrolithiasis on this postcontrast examination. Note is made of a punctate (approximately 0.7 cm) hypoattenuating lesion within the superior pole the left kidney (coronal image 108, series 14) which is too small to accurately characterize though appears similar to the 04/2019 examination and favored to represent a renal cyst. No discrete right-sided renal lesions. No urinary obstruction or perinephric stranding. Redemonstrated approximately 1.7 cm macroscopic fat containing left-sided adrenal myelolipoma (image 26, series 4). Normal appearance of the right adrenal gland. Normal appearance of the urinary bladder given degree of distention. Stomach/Bowel: Moderate liquid stool burden without evidence of enteric obstruction. There are no discrete areas of intraluminal contrast extravasation to suggest the etiology of  reported history of GI bleeding. Tiny hiatal hernia. No pneumoperitoneum, pneumatosis or portal venous gas. Lymphatic: Scattered retroperitoneal lymph nodes are not enlarged by size criteria. No bulky retroperitoneal, mesenteric, pelvic or inguinal lymphadenopathy. Reproductive: Dystrophic calcifications within a borderline enlarged prostate gland. No free fluid within the pelvic cul-de-sac. Tiny mesenteric fat containing periumbilical hernia. Other: There is a minimal amount of subcutaneous edema about the midline of the low back. Musculoskeletal: No acute or aggressive osseous abnormalities. 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 Stigmata of dish within the thoracic spine. IMPRESSION: VASCULAR 1. Scattered atherosclerotic plaque within normal caliber abdominal aorta, not resulting in hemodynamically significant stenosis. 2. No discrete areas of intraluminal contrast extravasation to suggest the etiology of reported history of GI bleeding. NON-VASCULAR 1. 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 2. Dystrophic calcifications within a borderline enlarged prostate. If not recently performed, further evaluation with DRE is advised. Electronically Signed   By: Sandi Mariscal M.D.   On: 12/23/2021 12:13    Procedures Procedures    Medications Ordered in ED Medications  sodium chloride (PF) 0.9 % injection (has no administration in time range)  docusate sodium (COLACE) capsule 100 mg (has no administration in time range)  polyethylene glycol (MIRALAX / GLYCOLAX) packet 17 g (has no administration in time range)  pantoprazole (PROTONIX) injection 40 mg (has no administration in time range)  sodium chloride 0.9 % bolus 500 mL (0 mLs Intravenous Stopped 12/23/21 1151)  iohexol  (OMNIPAQUE) 350 MG/ML injection 100 mL (100 mLs Intravenous Contrast Given 12/23/21 1124)  sodium chloride 0.9 % bolus 500 mL (500 mLs Intravenous New Bag/Given 12/23/21 1317)    ED Course/ Medical Decision Making/ A&P                           Medical Decision Making Problems Addressed: Acute GI bleeding: acute illness or injury that poses a threat to life or bodily functions Hypotension, unspecified hypotension type: acute illness or injury that poses a threat to life or bodily functions  Amount and/or Complexity of Data Reviewed External Data Reviewed: notes. Labs: ordered. Decision-making details documented in ED Course. Radiology: ordered and independent interpretation performed. ECG/medicine tests: ordered and independent interpretation performed.  Risk Decision regarding hospitalization.  Critical Care  Total time providing critical care: 30-74 minutes  Patient is a 61 year old male who presents with GI bleeding.  He was hypotensive on arrival with blood pressures in the 70s.  He was given IV fluid boluses and 500 cc increments given his history of CHF.  His blood pressure improved with fluids into the 90s and low 100s.  It intermittently has been dropping down but does respond to fluid boluses.  He had a total of 1500 cc at this point.  His hemoglobin is around 11 which is a drop from his baseline of 15.  He has gross blood on stool exam.  He had a CT scan of his abdomen pelvis with GI protocol which showed no areas of active bleeding.  Patient is currently a patient of the family practice center.  However patient is not stable enough for transfer to [facility] for admission to their service.  Initially consulted with the hospitalist who recommends ICU consult.  I spoke with Dr. Erin Fulling who will admit the patient for further treatment.    Final Clinical Impression(s) / ED Diagnoses Final diagnoses:  Acute GI bleeding  Hypotension, unspecified hypotension type    Rx / DC  Orders ED Discharge Orders     None         Malvin Johns, MD 12/23/21 1511

## 2021-12-23 NOTE — Consult Note (Signed)
Reason for Consult: Bright red blood per rectum Referring Physician: Hospital team  Micheal Hardin is an 61 y.o. male.  HPI: Patient seen and examined in his hospital computer chart reviewed and case discussed with the hospital team the patient has had 5 episodes of bright red blood or maroon stool since 4 AM without abdominal pain or other GI symptoms and he has not had a GI work-up in years and his family history is negative and he is on an aspirin a day and does take some cold medicine with aspirin frequently as well and he does have some chronic alternating diarrhea and constipation but has no other complaints and CT angiogram was negative for active bleeding  Past Medical History:  Diagnosis Date   Arthritis    CHF (congestive heart failure) (HCC)    Diabetes mellitus without complication (HCC)    Hypertension     Past Surgical History:  Procedure Laterality Date   SPLENECTOMY, TOTAL     TONSILLECTOMY      Family History  Problem Relation Age of Onset   Cancer Mother    Diabetes Father     Social History:  reports that he has never smoked. He has never used smokeless tobacco. He reports current drug use. Drug: Marijuana. He reports that he does not drink alcohol.  Allergies: No Known Allergies  Medications: I have reviewed the patient's current medications.  Results for orders placed or performed during the hospital encounter of 12/23/21 (from the past 48 hour(s))  Resp Panel by RT-PCR (Flu A&B, Covid) Nasopharyngeal Swab     Status: None   Collection Time: 12/23/21  9:37 AM   Specimen: Nasopharyngeal Swab; Nasopharyngeal(NP) swabs in vial transport medium  Result Value Ref Range   SARS Coronavirus 2 by RT PCR NEGATIVE NEGATIVE    Comment: (NOTE) SARS-CoV-2 target nucleic acids are NOT DETECTED.  The SARS-CoV-2 RNA is generally detectable in upper respiratory specimens during the acute phase of infection. The lowest concentration of SARS-CoV-2 viral copies this  assay can detect is 138 copies/mL. A negative result does not preclude SARS-Cov-2 infection and should not be used as the sole basis for treatment or other patient management decisions. A negative result may occur with  improper specimen collection/handling, submission of specimen other than nasopharyngeal swab, presence of viral mutation(s) within the areas targeted by this assay, and inadequate number of viral copies(<138 copies/mL). A negative result must be combined with clinical observations, patient history, and epidemiological information. The expected result is Negative.  Fact Sheet for Patients:  BloggerCourse.com  Fact Sheet for Healthcare Providers:  SeriousBroker.it  This test is no t yet approved or cleared by the [place] FDA and  has been authorized for detection and/or diagnosis of SARS-CoV-2 by FDA under an Emergency Use Authorization (EUA). This EUA will remain  in effect (meaning this test can be used) for the duration of the COVID-19 declaration under Section 564(b)(1) of the Act, 21 U.S.C.section 360bbb-3(b)(1), unless the authorization is terminated  or revoked sooner.       Influenza A by PCR NEGATIVE NEGATIVE   Influenza B by PCR NEGATIVE NEGATIVE    Comment: (NOTE) The Xpert Xpress SARS-CoV-2/FLU/RSV plus assay is intended as an aid in the diagnosis of influenza from Nasopharyngeal swab specimens and should not be used as a sole basis for treatment. Nasal washings and aspirates are unacceptable for Xpert Xpress SARS-CoV-2/FLU/RSV testing.  Fact Sheet for Patients: BloggerCourse.com  Fact Sheet for Healthcare Providers: SeriousBroker.it  This test  is not yet approved or cleared by the [place] and has been authorized for detection and/or diagnosis of SARS-CoV-2 by FDA under an Emergency Use Authorization (EUA). This EUA will remain in  effect (meaning this test can be used) for the duration of the COVID-19 declaration under Section 564(b)(1) of the Act, 21 U.S.C. section 360bbb-3(b)(1), unless the authorization is terminated or revoked.  Performed at [facility], [address], [place], [place] [address]   Comprehensive metabolic panel     Status: Abnormal   Collection Time: 12/23/21  9:37 AM  Result Value Ref Range   Sodium 132 (L) 135 - 145 mmol/L   Potassium 3.9 3.5 - 5.1 mmol/L   Chloride 106 98 - 111 mmol/L   CO2 19 (L) 22 - 32 mmol/L   Glucose, Bld 265 (H) 70 - 99 mg/dL    Comment: Glucose reference range applies only to samples taken after fasting for at least 8 hours.   BUN 26 (H) 8 - 23 mg/dL   Creatinine, Ser 5.28 (H) 0.61 - 1.24 mg/dL   Calcium 8.1 (L) 8.9 - 10.3 mg/dL   Total Protein 6.4 (L) 6.5 - 8.1 g/dL   Albumin 3.2 (L) 3.5 - 5.0 g/dL   AST 20 15 - 41 U/L   ALT 17 0 - 44 U/L   Alkaline Phosphatase 82 38 - 126 U/L   Total Bilirubin 0.5 0.3 - 1.2 mg/dL   GFR, Estimated 57 (L) >60 mL/min    Comment: (NOTE) Calculated using the CKD-EPI Creatinine Equation (2021)    Anion gap 7 5 - 15    Comment: Performed at [facility] [address] [place], [place] [address]  CBC     Status: Abnormal   Collection Time: 12/23/21  9:37 AM  Result Value Ref Range   WBC 9.4 4.0 - 10.5 K/uL   RBC 3.75 (L) 4.22 - 5.81 MIL/uL   Hemoglobin 11.5 (L) 13.0 - 17.0 g/dL   HCT 40.1 (L) 02.7 - 25.3 %   MCV 94.1 80.0 - 100.0 fL   MCH 30.7 26.0 - 34.0 pg   MCHC 32.6 30.0 - 36.0 g/dL   RDW 66.4 40.3 - 47.4 %   Platelets 228 150 - 400 K/uL   nRBC 0.0 0.0 - 0.2 %    Comment: Performed at [facility], [address] [place], [place] [address]  Type and screen [facility]     Status: None   Collection Time: 12/23/21  9:37 AM  Result Value Ref Range   ABO/RH(D) A NEG    Antibody Screen NEG    Sample Expiration       12/26/2021,2359 Performed at [facility], [address], [place], [place] [address]   Dickie [place] [address], ED     Status: Abnormal   Collection Time: 12/23/21  9:51 AM  Result Value Ref Range   Sodium 137 135 - 145 mmol/L   Potassium 4.1 3.5 - 5.1 mmol/L   Chloride 105 98 - 111 mmol/L   BUN 24 (H) 8 - 23 mg/dL   Creatinine, Ser 4.33 (H) 0.61 - 1.24 mg/dL   Glucose, Bld 295 (H) 70 - 99 mg/dL    Comment: Glucose reference range applies only to samples taken after fasting for at least 8 hours.   Calcium, Ion 1.15 1.15 - 1.40 mmol/L   TCO2 20 (L) 22 - 32 mmol/L   Hemoglobin 11.9 (L) 13.0 -  17.0 g/dL   HCT 16.1 (L) 09.6 - 04.5 %  I-stat chem 8, ED     Status: Abnormal   Collection Time: 12/23/21  1:23 PM  Result Value Ref Range   Sodium 138 135 - 145 mmol/L   Potassium 4.6 3.5 - 5.1 mmol/L   Chloride 106 98 - 111 mmol/L   BUN 26 (H) 8 - 23 mg/dL   Creatinine, Ser 4.09 0.61 - 1.24 mg/dL   Glucose, Bld 811 (H) 70 - 99 mg/dL    Comment: Glucose reference range applies only to samples taken after fasting for at least 8 hours.   Calcium, Ion 1.16 1.15 - 1.40 mmol/L   TCO2 22 22 - 32 mmol/L   Hemoglobin 11.9 (L) 13.0 - 17.0 g/dL   HCT 91.4 (L) 78.2 - 95.6 %  Lactic acid, plasma     Status: None   Collection Time: 12/23/21  1:57 PM  Result Value Ref Range   Lactic Acid, Venous 1.6 0.5 - 1.9 mmol/L    Comment: Performed at [facility], [address] [place], [place] [address]  Glucose, capillary     Status: Abnormal   Collection Time: 12/23/21  3:47 PM  Result Value Ref Range   Glucose-Capillary 224 (H) 70 - 99 mg/dL    Comment: Glucose reference range applies only to samples taken after fasting for at least 8 hours.    CT ANGIO GI BLEED  Result Date: 12/23/2021 CLINICAL DATA:  Left lower quadrant abdominal pain. Evaluate for GI bleeding. EXAM: CTA ABDOMEN AND PELVIS WITHOUT AND WITH CONTRAST TECHNIQUE: Multidetector CT imaging of the abdomen and  pelvis was performed using the standard protocol during bolus administration of intravenous contrast. Multiplanar reconstructed images and MIPs were obtained and reviewed to evaluate the vascular anatomy. RADIATION DOSE REDUCTION: This exam was performed according to the departmental dose-optimization program which includes automated exposure control, adjustment of the mA and/or kV according to patient size and/or use of iterative reconstruction technique. CONTRAST:  OMNIPAQUE IOHEXOL 350 MG/ML SOLN COMPARISON:  CT abdomen pelvis-09/15/2021; CT abdomen pelvis-05/07/2019; chest CT-05/25/2021 FINDINGS: VASCULAR Aorta: Minimal amount of atherosclerotic plaque within normal caliber abdominal aorta, not resulting in hemodynamically significant stenosis. No evidence of abdominal aortic dissection or perivascular stranding. Celiac: Widely patent without hemodynamically significant narrowing. Conventional branching pattern, though the splenic artery is expectedly atrophic in the setting of previous splenectomy. SMA: Widely patent without hemodynamically significant narrowing. Conventional branching pattern. The distal tributaries of the SMA appear widely patent without discrete intraluminal filling defect to suggest distal embolism. Renals: Solitary bilaterally; both renal arteries are widely patent without hemodynamically significant narrowing. No vessel irregularity to suggest FMD. IMA: Widely patent without hemodynamically significant narrowing. Inflow: There is a minimal amount of calcified atherosclerotic plaque involving the bilateral normal caliber common iliac arteries, not resulting in hemodynamically significant stenosis. The bilateral external iliac arteries are of normal caliber and while mildly tortuous are without a hemodynamically significant stenosis. The bilateral internal iliac arteries are mildly diseased though patent of normal caliber. Proximal Outflow: The bilateral common and imaged portions of  the bilateral deep and superficial femoral arteries are of normal caliber and widely patent without a hemodynamically significant stenosis. Veins: The IVC and pelvic venous systems are widely patent without hemodynamically significant narrowing. Review of the MIP images confirms the above findings. _________________________________________________________ NON-VASCULAR Lower chest: Limited visualization of the lower thorax demonstrates minimal dependent subpleural ground-glass atelectasis. No discrete focal airspace opacities. Normal heart size. Coronary artery  calcifications. Calcifications involving the mitral valve annulus. Decreased attenuation of the intra cardiac blood pool suggestive of anemia. Normal hepatic contour. Hepatobiliary: Normal hepatic contour. 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 No discrete hepatic lesions. Normal appearance of the gallbladder given degree distention. No radiopaque gallstones. No intra or extrahepatic biliary duct dilatation. No ascites. Pancreas: Normal appearance of the pancreas. Spleen: Surgically absent though redemonstrated several ectopic splenules with dominant splenule within the right upper abdominal quadrant adjacent to the posteromedial aspect the right lobe of the liver measuring approximately 2.4 x 1.3 cm,  dominant splenule within the ventral aspect of the right mid abdomen measuring 4.2 x 2.1 cm (image 43, series 13 and dominant splenule within the right lower abdominal quadrant measuring 4.4 x 2.9 cm (image 54, series 19). Adrenals/Urinary Tract: There is symmetric enhancement of the bilateral kidneys. No evidence of nephrolithiasis on this postcontrast examination. Note is made of a punctate (approximately 0.7 cm) hypoattenuating lesion within the superior pole the left kidney (coronal image 108, series 14) which is too small to accurately characterize though appears similar to the 04/2019 examination and favored to represent a renal cyst. No discrete right-sided renal lesions. No urinary obstruction or perinephric stranding. Redemonstrated approximately 1.7 cm macroscopic fat containing left-sided adrenal myelolipoma (image 26, series 4). Normal appearance of the right adrenal gland. Normal appearance of the urinary bladder given degree of distention. Stomach/Bowel: Moderate liquid stool burden without evidence of enteric obstruction. There are no discrete areas of intraluminal contrast extravasation to suggest the etiology of reported history of GI bleeding. Tiny hiatal hernia. No pneumoperitoneum, pneumatosis or portal venous gas. Lymphatic: Scattered retroperitoneal lymph nodes are not enlarged by size criteria. No bulky retroperitoneal, mesenteric, pelvic or inguinal lymphadenopathy. Reproductive: Dystrophic calcifications within a borderline enlarged prostate gland. No free fluid within the pelvic cul-de-sac. Tiny mesenteric fat containing periumbilical hernia. Other: There is a minimal amount of subcutaneous edema about the midline of the low back. Musculoskeletal: No acute or aggressive osseous abnormalities. 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 Stigmata of dish within the thoracic spine. IMPRESSION: VASCULAR 1. Scattered atherosclerotic plaque within normal caliber abdominal aorta, not resulting in hemodynamically significant stenosis. 2. No discrete areas of intraluminal contrast extravasation to suggest the etiology of reported history of GI bleeding. NON-VASCULAR 1. 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 2. Dystrophic calcifications within a borderline enlarged prostate. If not recently performed, further evaluation with DRE is advised. Electronically Signed   By: Simonne Come M.D.   On: 12/23/2021 12:13    ROS negative except above Blood pressure (!) 95/59, pulse 95, temperature 98.3 F (36.8 C), temperature source Oral, resp. rate 17, height  (1.676 m), weight 85.3 kg, SpO2 100 %.  Physical Exam physical exam vital signs stable afebrile no acute distress lying comfortably in the bed exam pertinent for his abdomen being soft nontender labs and CT reviewed  Assessment/Plan: Despite no diverticuli mentioned on CT report that would be the most likely diagnosis Plan: Case discussed with the nurse as well I will allow clear liquids and if bleeding continues or increases I would get a stat nuclear bleeding scan first and if positive consult IR otherwise our rounding team will see in the morning and consider colonoscopy during this hospital stay and please call sooner if question or problem particularly if bleeding is increasing  Jiselle Sheu E 12/23/2021, 4:39 PM

## 2021-12-23 NOTE — ED Triage Notes (Addendum)
BIB EMS, pt stated 4 hours ago, abd disturbance followed by BM blank and maroon color, hypotensive 92/60- 110- CBG 296. History of CHF EF 20%   #n 18 L AC 100 NS given  This is first episode of bleeding noted

## 2021-12-24 DIAGNOSIS — I959 Hypotension, unspecified: Secondary | ICD-10-CM

## 2021-12-24 DIAGNOSIS — K922 Gastrointestinal hemorrhage, unspecified: Secondary | ICD-10-CM

## 2021-12-24 DIAGNOSIS — K921 Melena: Secondary | ICD-10-CM | POA: Diagnosis not present

## 2021-12-24 LAB — CBC WITH DIFFERENTIAL/PLATELET
Abs Immature Granulocytes: 0.03 10*3/uL (ref 0.00–0.07)
Abs Immature Granulocytes: 0.02 10*3/uL (ref 0.00–0.07)
Abs Immature Granulocytes: 0.03 10*3/uL (ref 0.00–0.07)
Abs Immature Granulocytes: 0.02 10*3/uL (ref 0.00–0.07)
Basophils Absolute: 0.1 10*3/uL (ref 0.0–0.1)
Basophils Absolute: 0.1 10*3/uL (ref 0.0–0.1)
Basophils Absolute: 0.1 10*3/uL (ref 0.0–0.1)
Basophils Absolute: 0.1 10*3/uL (ref 0.0–0.1)
Basophils Relative: 1 %
Basophils Relative: 1 %
Basophils Relative: 1 %
Basophils Relative: 1 %
Eosinophils Absolute: 0.2 10*3/uL (ref 0.0–0.5)
Eosinophils Absolute: 0.2 10*3/uL (ref 0.0–0.5)
Eosinophils Absolute: 0.2 10*3/uL (ref 0.0–0.5)
Eosinophils Absolute: 0.2 10*3/uL (ref 0.0–0.5)
Eosinophils Relative: 2 %
Eosinophils Relative: 3 %
Eosinophils Relative: 2 %
Eosinophils Relative: 2 %
HCT: 29.6 % — ABNORMAL LOW (ref 39.0–52.0)
HCT: 30.9 % — ABNORMAL LOW (ref 39.0–52.0)
HCT: 31.3 % — ABNORMAL LOW (ref 39.0–52.0)
HCT: 32.5 % — ABNORMAL LOW (ref 39.0–52.0)
Hemoglobin: 9.7 g/dL — ABNORMAL LOW (ref 13.0–17.0)
Hemoglobin: 10 g/dL — ABNORMAL LOW (ref 13.0–17.0)
Hemoglobin: 10.4 g/dL — ABNORMAL LOW (ref 13.0–17.0)
Hemoglobin: 10.1 g/dL — ABNORMAL LOW (ref 13.0–17.0)
Immature Granulocytes: 0 %
Immature Granulocytes: 0 %
Immature Granulocytes: 0 %
Immature Granulocytes: 0 %
Lymphocytes Relative: 36 %
Lymphocytes Relative: 36 %
Lymphocytes Relative: 34 %
Lymphocytes Relative: 36 %
Lymphs Abs: 3.4 10*3/uL (ref 0.7–4.0)
Lymphs Abs: 3.3 10*3/uL (ref 0.7–4.0)
Lymphs Abs: 3.2 10*3/uL (ref 0.7–4.0)
Lymphs Abs: 3.3 10*3/uL (ref 0.7–4.0)
MCH: 31 pg (ref 26.0–34.0)
MCH: 30.9 pg (ref 26.0–34.0)
MCH: 31.5 pg (ref 26.0–34.0)
MCH: 31 pg (ref 26.0–34.0)
MCHC: 32.8 g/dL (ref 30.0–36.0)
MCHC: 32.4 g/dL (ref 30.0–36.0)
MCHC: 33.2 g/dL (ref 30.0–36.0)
MCHC: 31.1 g/dL (ref 30.0–36.0)
MCV: 94.6 fL (ref 80.0–100.0)
MCV: 95.4 fL (ref 80.0–100.0)
MCV: 94.8 fL (ref 80.0–100.0)
MCV: 99.7 fL (ref 80.0–100.0)
Monocytes Absolute: 0.9 10*3/uL (ref 0.1–1.0)
Monocytes Absolute: 0.8 10*3/uL (ref 0.1–1.0)
Monocytes Absolute: 0.8 10*3/uL (ref 0.1–1.0)
Monocytes Absolute: 0.8 10*3/uL (ref 0.1–1.0)
Monocytes Relative: 9 %
Monocytes Relative: 9 %
Monocytes Relative: 8 %
Monocytes Relative: 8 %
Neutro Abs: 5 10*3/uL (ref 1.7–7.7)
Neutro Abs: 4.8 10*3/uL (ref 1.7–7.7)
Neutro Abs: 5.1 10*3/uL (ref 1.7–7.7)
Neutro Abs: 4.9 10*3/uL (ref 1.7–7.7)
Neutrophils Relative %: 52 %
Neutrophils Relative %: 51 %
Neutrophils Relative %: 55 %
Neutrophils Relative %: 53 %
Platelets: 173 10*3/uL (ref 150–400)
Platelets: 176 10*3/uL (ref 150–400)
Platelets: 184 10*3/uL (ref 150–400)
Platelets: 180 10*3/uL (ref 150–400)
RBC: 3.13 MIL/uL — ABNORMAL LOW (ref 4.22–5.81)
RBC: 3.24 MIL/uL — ABNORMAL LOW (ref 4.22–5.81)
RBC: 3.3 MIL/uL — ABNORMAL LOW (ref 4.22–5.81)
RBC: 3.26 MIL/uL — ABNORMAL LOW (ref 4.22–5.81)
RDW: 13.8 % (ref 11.5–15.5)
RDW: 13.7 % (ref 11.5–15.5)
RDW: 13.9 % (ref 11.5–15.5)
RDW: 13.7 % (ref 11.5–15.5)
WBC: 9.6 10*3/uL (ref 4.0–10.5)
WBC: 9.3 10*3/uL (ref 4.0–10.5)
WBC: 9.3 10*3/uL (ref 4.0–10.5)
WBC: 9.2 10*3/uL (ref 4.0–10.5)
nRBC: 0 % (ref 0.0–0.2)
nRBC: 0 % (ref 0.0–0.2)
nRBC: 0 % (ref 0.0–0.2)
nRBC: 0 % (ref 0.0–0.2)

## 2021-12-24 LAB — BASIC METABOLIC PANEL
Anion gap: 5 (ref 5–15)
BUN: 20 mg/dL (ref 8–23)
CO2: 22 mmol/L (ref 22–32)
Calcium: 8.2 mg/dL — ABNORMAL LOW (ref 8.9–10.3)
Chloride: 109 mmol/L (ref 98–111)
Creatinine, Ser: 0.99 mg/dL (ref 0.61–1.24)
GFR, Estimated: >60 mL/min (ref >60)
Glucose, Bld: 128 mg/dL — ABNORMAL HIGH (ref 70–99)
Potassium: 3.9 mmol/L (ref 3.5–5.1)
Sodium: 136 mmol/L (ref 135–145)

## 2021-12-24 LAB — GLUCOSE, CAPILLARY
Glucose-Capillary: 130 mg/dL — ABNORMAL HIGH (ref 70–99)
Glucose-Capillary: 134 mg/dL — ABNORMAL HIGH (ref 70–99)
Glucose-Capillary: 130 mg/dL — ABNORMAL HIGH (ref 70–99)
Glucose-Capillary: 141 mg/dL — ABNORMAL HIGH (ref 70–99)
Glucose-Capillary: 161 mg/dL — ABNORMAL HIGH (ref 70–99)
Glucose-Capillary: 140 mg/dL — ABNORMAL HIGH (ref 70–99)

## 2021-12-24 LAB — HEMOGLOBIN A1C
Hgb A1c MFr Bld: 8.6 % — ABNORMAL HIGH (ref 4.8–5.6)
Mean Plasma Glucose: 200.12 mg/dL

## 2021-12-24 MED ORDER — LACTATED RINGERS IV SOLN: INTRAVENOUS | Status: DC

## 2021-12-24 MED ORDER — INSULIN ASPART 100 UNIT/ML IJ SOLN
0.0000 [IU] | Freq: Three times a day (TID) | INTRAMUSCULAR | Status: DC
  Administered 2021-12-24: 1 [IU] via SUBCUTANEOUS

## 2021-12-24 NOTE — Progress Notes (Signed)
°  Transition of Care [facility]) Screening Note   Patient Details  Name: Labarron Durnin Date of Birth: Sep 30, 1961   Transition of Care [facility]) CM/SW Contact:    Amada Jupiter, LCSW Phone Number: 12/24/2021, 9:29 AM    Transition of Care Department [facility]) has reviewed patient and no TOC needs have been identified at this time. We will continue to monitor patient advancement through interdisciplinary progression rounds. If new patient transition needs arise, please place a TOC consult.

## 2021-12-24 NOTE — Progress Notes (Signed)
[facility] Gastroenterology Progress Note  Micheal Hardin 61 y.o. Apr 16, 1961  CC:  GI bleeding   Subjective: Patient states he is feeling well today. Denies abdominal pain, nausea, vomiting. He reports a bowel movement last night with dark red blood in the toilet. Denies rectal pain or discomfort. He says he has had hemorrhoids in the past but usually can't feel them. He states he had a colonoscopy years ago, unsure when and where this was done.  ROS : Review of Systems  Cardiovascular:  Negative for chest pain and leg swelling.  Gastrointestinal:  Positive for blood in stool. Negative for abdominal pain, diarrhea, melena, nausea and vomiting.     Objective: Vital signs in last 24 hours: Vitals:   12/24/21 0800 12/24/21 0900  BP: 119/74 124/75  Pulse: 94 95  Resp: 17 11  Temp:    SpO2: 96% 94%    Physical Exam:  General:  Alert, cooperative, no distress, appears stated age  Head:  Normocephalic, without obvious abnormality, atraumatic  Eyes:  Anicteric sclera, EOM's intact  Lungs:   Clear to auscultation bilaterally, respirations unlabored  Heart:  Regular rate and rhythm, S1, S2 normal  Abdomen:   Soft, non-tender, bowel sounds active all four quadrants,  no masses,   Extremities: Extremities normal, atraumatic, no  edema  Pulses: 2+ and symmetric    Lab Results: Recent Labs    12/23/21 0937 12/23/21 0951 12/23/21 1323 12/24/21 0153  NA 132*   < > 138 136  K 3.9   < > 4.6 3.9  CL 106   < > 106 109  CO2 19*  --   --  22  GLUCOSE 265*   < > 219* 128*  BUN 26*   < > 26* 20  CREATININE 1.40*   < > 1.20 0.99  CALCIUM 8.1*  --   --  8.2*   < > = values in this interval not displayed.   Recent Labs    12/23/21 0937  AST 20  ALT 17  ALKPHOS 82  BILITOT 0.5  PROT 6.4*  ALBUMIN 3.2*   Recent Labs    12/24/21 0153 12/24/21 0731  WBC 9.6 9.3  NEUTROABS 5.0 4.8  HGB 9.7* 10.0*  HCT 29.6* 30.9*  MCV 94.6 95.4  PLT 173 176   No results for input(s):  LABPROT, INR in the last 72 hours.    Assessment GI bleeding - CTA Abdomen 2/5 without overt signs of bleeding or intestinal inflammation - Hgb 10.0 (last 9.7) - BUN 20, creatinine 0.99 - Patient had a bloody bowel movement last night with dark red blood in the toilet, un-measureable. Per nursing note, patient had several large hemorrhoids on exam.  AKI - BUN 20, creatinine 0.99 - GFR >60  HFrEF 25-30%  Plan: Hemoglobin stable, improving. Continue to monitor. Continue PPI BID. If continued or increased bleeding, consider nuclear bleeding scan with IR consult if positive. Consider colonoscopy this hospital visit if continued bleeding, if no further bleeding this can be done outpatient. [facility] will follow.  Berdine Dance PA-C 12/24/2021, 10:15 AM  Contact #  [phone]

## 2021-12-24 NOTE — Progress Notes (Signed)
NAME:  Micheal Hardin, MRN:  [id number], DOB:  [birth year], LOS: 0 ADMISSION DATE:  12/23/2021, CONSULTATION DATE:  12/23/21 REFERRING MD:  Rolan Bucco, MD CHIEF COMPLAINT:  GI Bleed    History of Present Illness:   Micheal Hardin is a 61 year old male with HFrEF 25-30%, DMII and hypertension who presented to the ER 2/5 with rectal bleeding that started overnight. He reports having abdominal pain followed by dark maroon bowel movements. Then bowel movements were then noted to be bright red blood.  He reports 4-5 liquid bowel movements.   Per EMS, his BP was 92/60. He was given 1L of NS in the ER with improvement in his blood pressure. Hgb 11.5g/dL with baseline around 42.5Z/DG. His blood pressure began to soften again prompting consult to PCCM.    Patient describes general abdominal discomfort that is greatest on the left.  He denies any fevers chills, nausea or vomiting.  He had been feeling his normal self until last evening.  He denies any significant NSAID usage or alcohol use.  He reports a remote history of peptic ulcer disease 15 to 20 years ago.    Pertinent  Medical History   Past Medical History:  Diagnosis Date   Arthritis     CHF (congestive heart failure) (HCC)     Diabetes mellitus without complication (HCC)     Hypertension     Significant Hospital Events: Including procedures, antibiotic start and stop dates in addition to other pertinent events   2/5 admitted to ICU for GI Bleeding     CTA Abdomen;Pelvis 2/5 IMPRESSION: VASCULAR   1. Scattered atherosclerotic plaque within normal caliber abdominal aorta, not resulting in hemodynamically significant stenosis. 2. No discrete areas of intraluminal contrast extravasation to suggest the etiology of reported history of GI bleeding.   NON-VASCULAR   1. 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 2. Dystrophic calcifications within a borderline  enlarged prostate. If not recently performed, further evaluation with DRE is advised.  Interim History / Subjective:  Pt.  Awake and alert, one episode of stool with clots in it overnight, BP stable and patient denies pain or light-headedness  Objective   Blood pressure (!) 98/47, pulse 89, temperature 97.8 F (36.6 C), resp. rate 14, height 5\' 6"  (1.676 m), weight 86.8 kg, SpO2 92 %.        Intake/Output Summary (Last 24 hours) at 12/24/2021 0830 Last data filed at 12/24/2021 0600 Gross per 24 hour  Intake 909.82 ml  Output --  Net 909.82 ml   Filed Weights   12/23/21 0932 12/23/21 1500 12/24/21 0500  Weight: 81.6 kg 85.3 kg 86.8 kg    General:  well-nourished M, awake and in no distress on RA HEENT: MM pink/moist, sclera anicteric  Neuro: alert, oriented x3, moving all extremities without focal deficitis CV: s1s2 rrr, no m/r/g PULM:  clear to auscultation bilaterally on RA GI: soft, bsx4 active, very mild TTP in the epigastric area Extremities: warm/dry, no edema  Skin: no rashes or lesions   Resolved Hospital Problem list     Assessment & Plan:   Acute GI Bleed Acute Blood Loss Anemia -CTA abdomen from previous ER visit 09/15/21 with no mention of diverticulosis. CTA Abdomen today without overt signs of bleeding or intestinal inflammation. -continue pantoprazole IV 40 mg twice daily -continue serial CBC's q6hrs and transfuse for Htn <7  -Large bore IV's in place - GI following, if bleeding worsens then nuclear bleeding  scan, consider colonoscopy - continue PPI bid - Type screen completed, patient consented to blood products if needed   Hypotension Resolved -continue to monitor, stable for transfer out of intensive care   HFrEF 25-30% - on RA, no evidence of volume overload  - hold carvedilol, entresto and spironolactone, can likely resume today if BP remains stable   Acute Kidney Injury Resolved after IVF -continue to monitor Creatinine - avoid nephrotoxic  agents   DM II - hold home metformin and jardiance - SSI   Best Practice (right click and "Reselect all SmartList Selections" daily)   Diet/type: clear liquids DVT prophylaxis: SCD GI prophylaxis: PPI Lines: N/A Foley:  N/A Code Status:  full code Last date of multidisciplinary goals of care discussion []   Labs   CBC: Recent Labs  Lab 12/23/21 0937 12/23/21 0951 12/23/21 1323 12/23/21 1600 12/23/21 2118 12/24/21 0153 12/24/21 0731  WBC 9.4  --   --  9.6 8.4 9.6 9.3  NEUTROABS  --   --   --   --   --  5.0 4.8  HGB 11.5*   < > 11.9* 11.0* 9.9* 9.7* 10.0*  HCT 35.3*   < > 35.0* 34.4* 30.4* 29.6* 30.9*  MCV 94.1  --   --  96.1 95.0 94.6 95.4  PLT 228  --   --  203 176 173 176   < > = values in this interval not displayed.    Basic Metabolic Panel: Recent Labs  Lab 12/23/21 0937 12/23/21 0951 12/23/21 1323 12/24/21 0153  NA 132* 137 138 136  K 3.9 4.1 4.6 3.9  CL 106 105 106 109  CO2 19*  --   --  22  GLUCOSE 265* 261* 219* 128*  BUN 26* 24* 26* 20  CREATININE 1.40* 1.30* 1.20 0.99  CALCIUM 8.1*  --   --  8.2*   GFR: Estimated Creatinine Clearance: 79.9 mL/min (by C-G formula based on SCr of 0.99 mg/dL). Recent Labs  Lab 12/23/21 1357 12/23/21 1600 12/23/21 2118 12/24/21 0153 12/24/21 0731  WBC  --  9.6 8.4 9.6 9.3  LATICACIDVEN 1.6  --  1.7  --   --     Liver Function Tests: Recent Labs  Lab 12/23/21 0937  AST 20  ALT 17  ALKPHOS 82  BILITOT 0.5  PROT 6.4*  ALBUMIN 3.2*   No results for input(s): LIPASE, AMYLASE in the last 168 hours. No results for input(s): AMMONIA in the last 168 hours.  ABG    Component Value Date/Time   TCO2 22 12/23/2021 1323     Coagulation Profile: No results for input(s): INR, PROTIME in the last 168 hours.  Cardiac Enzymes: No results for input(s): CKTOTAL, CKMB, CKMBINDEX, TROPONINI in the last 168 hours.  HbA1C: HbA1c, POC (controlled diabetic range)  Date/Time Value Ref Range Status  12/13/2021  03:28 PM 8.7 (A) 0.0 - 7.0 % Final  08/20/2021 04:47 PM 9.0 (A) 0.0 - 7.0 % Final   Hgb A1c MFr Bld  Date/Time Value Ref Range Status  12/23/2021 09:18 PM 8.6 (H) 4.8 - 5.6 % Final    Comment:    (NOTE) Pre diabetes:          5.7%-6.4%  Diabetes:              >6.4%  Glycemic control for   <7.0% adults with diabetes     CBG: Recent Labs  Lab 12/23/21 1547 12/23/21 1956 12/23/21 2333 12/24/21 0328 12/24/21 0751  GLUCAP 224*  188* 121* 130* 134*    Review of Systems:   Please see the history of present illness. All other systems reviewed and are negative    Past Medical History:  He,  has a past medical history of Arthritis, CHF (congestive heart failure) (HCC), Diabetes mellitus without complication (HCC), and Hypertension.   Surgical History:   Past Surgical History:  Procedure Laterality Date   SPLENECTOMY, TOTAL     TONSILLECTOMY       Social History:   reports that he has never smoked. He has never used smokeless tobacco. He reports current drug use. Drug: Marijuana. He reports that he does not drink alcohol.   Family History:  His family history includes Cancer in his mother; Diabetes in his father.   Allergies No Known Allergies   Home Medications  Prior to Admission medications   Medication Sig Start Date End Date Taking? Authorizing Provider  aspirin 81 MG EC tablet Take 1 tablet (81 mg total) by mouth daily. Swallow whole. 04/18/21  Yes Ghimire, Werner Lean, MD  atorvastatin (LIPITOR) 40 MG tablet Take 1 tablet by mouth once daily Patient taking differently: Take 40 mg by mouth daily. 07/30/21  Yes Angelita Ingles, MD  carvedilol (COREG) 6.25 MG tablet TAKE 1 TABLET BY MOUTH TWICE DAILY WITH A MEAL Patient taking differently: Take 6.25 mg by mouth 2 (two) times daily with a meal. 07/30/21  Yes Winfrey, Kimberlee Nearing, MD  empagliflozin (JARDIANCE) 10 MG TABS tablet Take 1 tablet (10 mg total) by mouth daily. 08/14/21  Yes Winfrey, Kimberlee Nearing, MD  ENTRESTO  24-26 MG Take 1 tablet by mouth twice daily Patient taking differently: Take 1 tablet by mouth 2 (two) times daily. 07/30/21  Yes Angelita Ingles, MD  furosemide (LASIX) 40 MG tablet Take 1 tablet (40 mg total) by mouth as needed. Patient taking differently: Take 40 mg by mouth daily. 05/17/21  Yes Angelita Ingles, MD  metFORMIN (GLUCOPHAGE-XR) 500 MG 24 hr tablet Take 2 tablets (1,000 mg total) by mouth 2 (two) times daily. 12/13/21  Yes Lilland, Alana, DO  nitroGLYCERIN (NITROSTAT) 0.4 MG SL tablet Place 0.4 mg under the tongue every 5 (five) minutes as needed for chest pain.   Yes [provider]  spironolactone (ALDACTONE) 25 MG tablet Take 1/2 tablet (12.5 mg total) by mouth daily. 05/17/21  Yes Angelita Ingles, MD  ondansetron (ZOFRAN ODT) 4 MG disintegrating tablet Take 1 tablet (4 mg total) by mouth every 8 (eight) hours as needed for nausea or vomiting. Patient not taking: Reported on 12/23/2021 09/15/21   Wynetta Fines, MD  potassium chloride SA (KLOR-CON) 20 MEQ tablet Take 1 tablet (20 mEq total) by mouth 2 (two) times daily for 2 days. Patient not taking: Reported on 09/15/2021 05/09/21 05/11/21  Angelita Ingles, MD     Critical care time: n/a       Darcella Gasman Daequan Kozma, PA-C [facility] See Amion for pager If no response to pager , please call [phone] until 7pm After 7:00 pm call Elink  [phone]

## 2021-12-24 NOTE — Progress Notes (Signed)
Patient experienced one bloody bowel movement at approximately 2130. Dark red clots that were un-measurable were observed at the bottom of the toilet along with bright red blood. Rectum was observed during peri care and several large hemorrhoids were seen outside of the rectum. They did not appear to be actively bleeding, but patient was not aware he had them and does not know if he could potentially have inner hemorrhoids as well. Daytime physician would like nuclear scan done when available, which is not offered here at night. Patient hgb has remained stable even after this, and he is asymptomatic. Next hgb will be drawn at 0800. Will continue to assess for change in status.

## 2021-12-25 DIAGNOSIS — K922 Gastrointestinal hemorrhage, unspecified: Secondary | ICD-10-CM | POA: Diagnosis not present

## 2021-12-25 DIAGNOSIS — K921 Melena: Secondary | ICD-10-CM | POA: Diagnosis not present

## 2021-12-25 LAB — CBC WITH DIFFERENTIAL/PLATELET
Abs Immature Granulocytes: 0.02 10*3/uL (ref 0.00–0.07)
Abs Immature Granulocytes: 0.02 10*3/uL (ref 0.00–0.07)
Basophils Absolute: 0.1 10*3/uL (ref 0.0–0.1)
Basophils Absolute: 0.1 10*3/uL (ref 0.0–0.1)
Basophils Relative: 1 %
Basophils Relative: 1 %
Eosinophils Absolute: 0.2 10*3/uL (ref 0.0–0.5)
Eosinophils Absolute: 0.2 10*3/uL (ref 0.0–0.5)
Eosinophils Relative: 2 %
Eosinophils Relative: 2 %
HCT: 29.7 % — ABNORMAL LOW (ref 39.0–52.0)
HCT: 30.8 % — ABNORMAL LOW (ref 39.0–52.0)
Hemoglobin: 9.3 g/dL — ABNORMAL LOW (ref 13.0–17.0)
Hemoglobin: 10.3 g/dL — ABNORMAL LOW (ref 13.0–17.0)
Immature Granulocytes: 0 %
Immature Granulocytes: 0 %
Lymphocytes Relative: 41 %
Lymphocytes Relative: 39 %
Lymphs Abs: 3.1 10*3/uL (ref 0.7–4.0)
Lymphs Abs: 2.8 10*3/uL (ref 0.7–4.0)
MCH: 30.5 pg (ref 26.0–34.0)
MCH: 31.6 pg (ref 26.0–34.0)
MCHC: 31.3 g/dL (ref 30.0–36.0)
MCHC: 33.4 g/dL (ref 30.0–36.0)
MCV: 97.4 fL (ref 80.0–100.0)
MCV: 94.5 fL (ref 80.0–100.0)
Monocytes Absolute: 0.8 10*3/uL (ref 0.1–1.0)
Monocytes Absolute: 0.8 10*3/uL (ref 0.1–1.0)
Monocytes Relative: 10 %
Monocytes Relative: 11 %
Neutro Abs: 3.5 10*3/uL (ref 1.7–7.7)
Neutro Abs: 3.5 10*3/uL (ref 1.7–7.7)
Neutrophils Relative %: 46 %
Neutrophils Relative %: 47 %
Platelets: 162 10*3/uL (ref 150–400)
Platelets: 175 10*3/uL (ref 150–400)
RBC: 3.05 MIL/uL — ABNORMAL LOW (ref 4.22–5.81)
RBC: 3.26 MIL/uL — ABNORMAL LOW (ref 4.22–5.81)
RDW: 13.7 % (ref 11.5–15.5)
RDW: 13.7 % (ref 11.5–15.5)
WBC: 7.7 10*3/uL (ref 4.0–10.5)
WBC: 7.4 10*3/uL (ref 4.0–10.5)
nRBC: 0 % (ref 0.0–0.2)
nRBC: 0 % (ref 0.0–0.2)

## 2021-12-25 LAB — GLUCOSE, CAPILLARY: Glucose-Capillary: 142 mg/dL — ABNORMAL HIGH (ref 70–99)

## 2021-12-25 LAB — BASIC METABOLIC PANEL
Anion gap: 6 (ref 5–15)
BUN: 12 mg/dL (ref 8–23)
CO2: 22 mmol/L (ref 22–32)
Calcium: 8.4 mg/dL — ABNORMAL LOW (ref 8.9–10.3)
Chloride: 108 mmol/L (ref 98–111)
Creatinine, Ser: 0.93 mg/dL (ref 0.61–1.24)
GFR, Estimated: >60 mL/min (ref >60)
Glucose, Bld: 130 mg/dL — ABNORMAL HIGH (ref 70–99)
Potassium: 3.9 mmol/L (ref 3.5–5.1)
Sodium: 136 mmol/L (ref 135–145)

## 2021-12-25 MED ORDER — PANTOPRAZOLE SODIUM 40 MG PO TBEC: 40.0000 mg | DELAYED_RELEASE_TABLET | Freq: Every day | ORAL | 1 refills | Status: AC

## 2021-12-25 NOTE — Discharge Instructions (Signed)
Advised to take pantoprazole 40 mg daily. Advised to follow-up with gastroenterology for outpatient colonoscopy

## 2021-12-25 NOTE — Discharge Summary (Signed)
Physician Discharge Summary  Micheal Hardin WJX:[id number] DOB: 1961/01/29 DOA: 12/23/2021  PCP: Derrel Nip, MD  Admit date: 12/23/2021  Discharge date: 12/25/2021  Admitted From: Home.  Disposition:  Home.  Recommendations for Outpatient Follow-up:  Follow up with PCP in 1-2 weeks. Please obtain BMP/CBC in one week. Advised to take Pantoprazole 40 mg daily. Advised to follow-up with gastroenterology for outpatient colonoscopy.  Home Health: None Equipment/Devices: None  Discharge Condition: Stable CODE STATUS:Full code Diet recommendation: Heart Healthy   Brief [facility] Course: This 61 year old male with PMH significant for  HFrEF 25-30%, DMII and hypertension who presented to the ER with rectal bleeding that started overnight. He reports having abdominal pain followed by dark maroon bowel movements. Then bowel movements were then noted to be bright red blood.  He reports 4-5 liquid bowel movements.  He was hypotensive on arrival and was given IV fluids.  Blood pressure has improved.  His baseline hemoglobin 15.6 , hemoglobin on arrival 11.5. He denies any significant NSAID usage or alcohol use.  He reports a remote history of peptic ulcer disease 15 to 20 years ago. Patient was admitted in the ICU for lower GI bleeding, blood pressure has improved with hydration.  GI was consulted.  Hemoglobin remained stable.  Bleeding has stopped with Protonix twice daily.  CT abdomen and pelvis shows there is no active bleeding.  Patient felt better.  Patient is cleared from GI to be discharged and advised to follow-up with GI for outpatient colonoscopy.  He tolerated soft bland diet.  Patient is being discharged home.  Discharge Diagnoses:  Principal Problem:   Acute GI bleeding Active Problems:   Hypotension  Acute GI Bleed Acute Blood Loss Anemia CTA abdomen from previous ER visit 09/15/21 with no mention of diverticulosis.  CTA Abdomen today without overt signs of bleeding or  intestinal inflammation. Continued pantoprazole IV 40 mg twice daily GI has been consulted.  Hemoglobin remained stable. Bleeding has resolved.  Patient tolerated soft bland diet Patient is cleared from GI to be discharged with outpatient colonoscopy scheduled.    Hypotension > Resolved. - given 1L in the ER - Bolus as needed   HFrEF 25-30% Continue to monitor Resumed carvedilol, entresto and spironolactone   Acute Kidney Injury > resolved Improved with IV hydration    DM II - hold home metformin and jardiance - SSI  Discharge Instructions  Discharge Instructions     Call MD for:  difficulty breathing, headache or visual disturbances   Complete by: As directed    Call MD for:  persistant dizziness or light-headedness   Complete by: As directed    Call MD for:  persistant nausea and vomiting   Complete by: As directed    Diet - low sodium heart healthy   Complete by: As directed    Diet Carb Modified   Complete by: As directed    Discharge instructions   Complete by: As directed    Advised to follow-up with primary care physician in 1 week. Advised to take pantoprazole 40 mg daily. Advised to follow-up with gastroenterology for outpatient colonoscopy   Increase activity slowly   Complete by: As directed       Allergies as of 12/25/2021   No Known Allergies      Medication List     STOP taking these medications    ondansetron 4 MG disintegrating tablet Commonly known as: Zofran ODT   potassium chloride SA 20 MEQ tablet Commonly known as: KLOR-CON  M       TAKE these medications    Aspirin Low Dose 81 MG EC tablet Generic drug: aspirin Take 1 tablet (81 mg total) by mouth daily. Swallow whole. Notes to patient: 12/25/21   atorvastatin 40 MG tablet Commonly known as: LIPITOR Take 1 tablet by mouth once daily Notes to patient: 12/25/21   carvedilol 6.25 MG tablet Commonly known as: COREG TAKE 1 TABLET BY MOUTH TWICE DAILY WITH A MEAL Notes to  patient: 12/25/21   empagliflozin 10 MG Tabs tablet Commonly known as: Jardiance Take 1 tablet (10 mg total) by mouth daily. Notes to patient: 12/25/21   Entresto 24-26 MG Generic drug: sacubitril-valsartan Take 1 tablet by mouth twice daily Notes to patient: 12/25/21   furosemide 40 MG tablet Commonly known as: LASIX Take 1 tablet (40 mg total) by mouth as needed. What changed: when to take this   metFORMIN 500 MG 24 hr tablet Commonly known as: GLUCOPHAGE-XR Take 2 tablets (1,000 mg total) by mouth 2 (two) times daily. Notes to patient: 12/25/21   nitroGLYCERIN 0.4 MG SL tablet Commonly known as: NITROSTAT Place 0.4 mg under the tongue every 5 (five) minutes as needed for chest pain.   pantoprazole 40 MG tablet Commonly known as: Protonix Take 1 tablet (40 mg total) by mouth daily. Notes to patient: 12/25/21   spironolactone 25 MG tablet Commonly known as: ALDACTONE Take 1/2 tablet (12.5 mg total) by mouth daily. Notes to patient: 12/25/21        Follow-up Information     Derrel Nip, MD Follow up in 1 week(s).   Specialty: Family Medicine Contact information: [address] [place] [address] [phone]         Charlott Rakes, MD Follow up in 1 week(s).   Specialty: Gastroenterology Contact information: [address] [place] [address] [phone]                No Known Allergies  Consultations: Gastroenterology   Procedures/Studies: CT ANGIO GI BLEED  Result Date: 12/23/2021 CLINICAL DATA:  Left lower quadrant abdominal pain. Evaluate for GI bleeding. EXAM: CTA ABDOMEN AND PELVIS WITHOUT AND WITH CONTRAST TECHNIQUE: Multidetector CT imaging of the abdomen and pelvis was performed using the standard protocol during bolus administration of intravenous contrast. Multiplanar reconstructed images and MIPs were obtained and reviewed to evaluate the vascular anatomy. RADIATION DOSE REDUCTION: This exam was performed  according to the departmental dose-optimization program which includes automated exposure control, adjustment of the mA and/or kV according to patient size and/or use of iterative reconstruction technique. CONTRAST:  OMNIPAQUE IOHEXOL 350 MG/ML SOLN COMPARISON:  CT abdomen pelvis-09/15/2021; CT abdomen pelvis-05/07/2019; chest CT-05/25/2021 FINDINGS: VASCULAR Aorta: Minimal amount of atherosclerotic plaque within normal caliber abdominal aorta, not resulting in hemodynamically significant stenosis. No evidence of abdominal aortic dissection or perivascular stranding. Celiac: Widely patent without hemodynamically significant narrowing. Conventional branching pattern, though the splenic artery is expectedly atrophic in the setting of previous splenectomy. SMA: Widely patent without hemodynamically significant narrowing. Conventional branching pattern. The distal tributaries of the SMA appear widely patent without discrete intraluminal filling defect to suggest distal embolism. Renals: Solitary bilaterally; both renal arteries are widely patent without hemodynamically significant narrowing. No vessel irregularity to suggest FMD. IMA: Widely patent without hemodynamically significant narrowing. Inflow: There is a minimal amount of calcified atherosclerotic plaque involving the bilateral normal caliber common iliac arteries, not resulting in hemodynamically significant stenosis. The bilateral external iliac arteries are of normal caliber  and while mildly tortuous are without a hemodynamically significant stenosis. The bilateral internal iliac arteries are mildly diseased though patent of normal caliber. Proximal Outflow: The bilateral common and imaged portions of the bilateral deep and superficial femoral arteries are of normal caliber and widely patent without a hemodynamically significant stenosis. Veins: The IVC and pelvic venous systems are widely patent without hemodynamically significant narrowing. Review of  the MIP images confirms the above findings. _________________________________________________________ NON-VASCULAR Lower chest: Limited visualization of the lower thorax demonstrates minimal dependent subpleural ground-glass atelectasis. No discrete focal airspace opacities. Normal heart size. Coronary artery calcifications. Calcifications involving the mitral valve annulus. Decreased attenuation of the intra cardiac blood pool suggestive of anemia. Normal hepatic contour. Hepatobiliary: Normal hepatic contour. 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 No discrete hepatic lesions. Normal appearance of the gallbladder given degree distention. No radiopaque gallstones. No intra or extrahepatic biliary duct dilatation. No ascites. Pancreas: Normal appearance of the pancreas. Spleen: Surgically absent though redemonstrated several ectopic splenules with dominant splenule within the right upper abdominal quadrant adjacent to the posteromedial aspect the right lobe of the liver measuring approximately 2.4 x 1.3 cm, dominant splenule within the ventral aspect of the right mid abdomen measuring 4.2 x 2.1 cm (image 43, series 13 and dominant splenule within the right lower abdominal quadrant measuring 4.4 x 2.9 cm (image 54, series 19). Adrenals/Urinary Tract: There is  symmetric enhancement of the bilateral kidneys. No evidence of nephrolithiasis on this postcontrast examination. Note is made of a punctate (approximately 0.7 cm) hypoattenuating lesion within the superior pole the left kidney (coronal image 108, series 14) which is too small to accurately characterize though appears similar to the 04/2019 examination and favored to represent a renal cyst. No discrete right-sided renal lesions. No urinary obstruction or perinephric stranding. Redemonstrated approximately 1.7 cm macroscopic fat containing left-sided adrenal myelolipoma (image 26, series 4). Normal appearance of the right adrenal gland. Normal appearance of the urinary bladder given degree of distention. Stomach/Bowel: Moderate liquid stool burden without evidence of enteric obstruction. There are no discrete areas of intraluminal contrast extravasation to suggest the etiology of reported history of GI bleeding. Tiny hiatal hernia. No pneumoperitoneum, pneumatosis or portal venous gas. Lymphatic: Scattered retroperitoneal lymph nodes are not enlarged by size criteria. No bulky retroperitoneal, mesenteric, pelvic or inguinal lymphadenopathy. Reproductive: Dystrophic calcifications within a borderline enlarged prostate gland. No free fluid within the pelvic cul-de-sac. Tiny mesenteric fat containing periumbilical hernia. Other: There is a minimal amount of subcutaneous edema about the midline of the low back. Musculoskeletal: No acute or aggressive osseous abnormalities. 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 Stigmata of dish within the thoracic spine. IMPRESSION: VASCULAR 1. Scattered atherosclerotic plaque within normal caliber abdominal aorta, not resulting in hemodynamically significant  stenosis. 2. No discrete areas of intraluminal contrast  extravasation to suggest the etiology of reported history of GI bleeding. NON-VASCULAR 1. 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 2. Dystrophic calcifications within a borderline enlarged prostate. If not recently performed, further evaluation with DRE is advised. Electronically Signed   By: Simonne Come M.D.   On: 12/23/2021 12:13     Subjective: Patient was seen and examined at bedside.  Overnight events noted.   Patient reports feeling better,  bleeding has resolved.  Patient wants to be discharged.  Discharge Exam: Vitals:   12/25/21 0812 12/25/21 0900  BP: 131/87 (!) 147/86  Pulse: 98 (!) 103  Resp: 19 17  Temp: 97.9 F (36.6 C)   SpO2: 97% 98%   Vitals:   12/25/21 0700 12/25/21 0800 12/25/21 0812 12/25/21 0900  BP: 129/77 129/90 131/87 (!) 147/86  Pulse: 93 95 98 (!) 103  Resp: (!) 0 Temp:   97.9 F (36.6 C)   TempSrc:   Oral   SpO2: 92% 97% 97% 98%  Weight:      Height:        General: Pt is alert, awake, not in acute distress Cardiovascular: RRR, S1/S2 +, no rubs, no gallops Respiratory: CTA bilaterally, no wheezing, no rhonchi Abdominal: Soft, NT, ND, bowel sounds + Extremities: no edema, no cyanosis    The results of significant diagnostics from this hospitalization (including imaging, microbiology, ancillary and laboratory) are listed below for reference.     Microbiology: Recent Results (from the past 240 hour(s))  Resp Panel by RT-PCR (Flu A&B, Covid) Nasopharyngeal Swab     Status: None   Collection Time: 12/23/21  9:37 AM   Specimen: Nasopharyngeal Swab; Nasopharyngeal(NP) swabs in vial transport medium  Result Value Ref Range Status   SARS Coronavirus 2 by RT PCR NEGATIVE NEGATIVE Final    Comment: (NOTE) SARS-CoV-2 target nucleic acids are NOT DETECTED.  The SARS-CoV-2 RNA is generally detectable in upper respiratory specimens during the acute phase of infection. The  lowest concentration of SARS-CoV-2 viral copies this assay can detect is 138 copies/mL. A negative result does not preclude SARS-Cov-2 infection and should not be used as the sole basis for treatment or other patient management decisions. A negative result may occur with  improper specimen collection/handling, submission of specimen other than nasopharyngeal swab, presence of viral mutation(s) within the areas targeted by this assay, and inadequate number of viral copies(<138 copies/mL). A negative result must be combined with clinical observations, patient history, and epidemiological information. The expected result is Negative.  Fact Sheet for Patients:  BloggerCourse.com  Fact Sheet for Healthcare Providers:  SeriousBroker.it  This test is no t yet approved or cleared by the [place] FDA and  has been authorized for detection and/or diagnosis of SARS-CoV-2 by FDA under an Emergency Use Authorization (EUA). This EUA will remain  in effect (meaning this test can be used) for the duration of the COVID-19 declaration under Section 564(b)(1) of the Act, 21 U.S.C.section 360bbb-3(b)(1), unless the authorization is terminated  or revoked sooner.       Influenza A by PCR NEGATIVE NEGATIVE Final   Influenza B by PCR NEGATIVE NEGATIVE Final    Comment: (NOTE) The Xpert Xpress SARS-CoV-2/FLU/RSV plus assay is intended as an aid in the diagnosis of influenza from Nasopharyngeal swab specimens and should not be used as a sole basis for treatment. Nasal washings and aspirates are unacceptable for  Xpert Xpress SARS-CoV-2/FLU/RSV testing.  Fact Sheet for Patients: BloggerCourse.com  Fact Sheet for Healthcare Providers: SeriousBroker.it  This test is not yet approved or cleared by the [place] FDA and has been authorized for detection and/or diagnosis of SARS-CoV-2 by FDA under  an Emergency Use Authorization (EUA). This EUA will remain in effect (meaning this test can be used) for the duration of the COVID-19 declaration under Section 564(b)(1) of the Act, 21 U.S.C. section 360bbb-3(b)(1), unless the authorization is terminated or revoked.  Performed at [facility], [address], [place], [place] [address]   MRSA Next Gen by PCR, Nasal     Status: None   Collection Time: 12/23/21  3:18 PM   Specimen: Nasal Mucosa; Nasal Swab  Result Value Ref Range Status   MRSA by PCR Next Gen NOT DETECTED NOT DETECTED Final    Comment: (NOTE) The GeneXpert MRSA Assay (FDA approved for NASAL specimens only), is one component of a comprehensive MRSA colonization surveillance program. It is not intended to diagnose MRSA infection nor to guide or monitor treatment for MRSA infections. Test performance is not FDA approved in patients less than 69 years old. Performed at [facility], [address], [place], [place] [address]      Labs: BNP (last 3 results) Recent Labs    04/15/21 1426 04/25/21 1527 09/14/21 1634  BNP 312.2* 31.2 104.5*   Basic Metabolic Panel: Recent Labs  Lab 12/23/21 0937 12/23/21 0951 12/23/21 1323 12/24/21 0153 12/25/21 0309  NA 132* 137 138 136 136  K 3.9 4.1 4.6 3.9 3.9  CL 106 105 106 109 108  CO2 19*  --   --  22 22  GLUCOSE 265* 261* 219* 128* 130*  BUN 26* 24* 26* 20 12  CREATININE 1.40* 1.30* 1.20 0.99 0.93  CALCIUM 8.1*  --   --  8.2* 8.4*   Liver Function Tests: Recent Labs  Lab 12/23/21 0937  AST 20  ALT 17  ALKPHOS 82  BILITOT 0.5  PROT 6.4*  ALBUMIN 3.2*   No results for input(s): LIPASE, AMYLASE in the last 168 hours. No results for input(s): AMMONIA in the last 168 hours. CBC: Recent Labs  Lab 12/24/21 0731 12/24/21 1347 12/24/21 2003 12/25/21 0309 12/25/21 0736  WBC 9.3 9.3 9.2 7.7 7.4  NEUTROABS 4.8 5.1 4.9 3.5 3.5  HGB 10.0* 10.4* 10.1* 9.3* 10.3*  HCT  30.9* 31.3* 32.5* 29.7* 30.8*  MCV 95.4 94.8 99.7 97.4 94.5  PLT 176 184 180 162 175   Cardiac Enzymes: No results for input(s): CKTOTAL, CKMB, CKMBINDEX, TROPONINI in the last 168 hours. BNP: Invalid input(s): POCBNP CBG: Recent Labs  Lab 12/24/21 1125 12/24/21 1556 12/24/21 2046 12/24/21 2222 12/25/21 0759  GLUCAP 130* 141* 161* 140* 142*   D-Dimer No results for input(s): DDIMER in the last 72 hours. Hgb A1c Recent Labs    12/23/21 2118  HGBA1C 8.6*   Lipid Profile No results for input(s): CHOL, HDL, LDLCALC, TRIG, CHOLHDL, LDLDIRECT in the last 72 hours. Thyroid function studies No results for input(s): TSH, T4TOTAL, T3FREE, THYROIDAB in the last 72 hours.  Invalid input(s): FREET3 Anemia work up No results for input(s): VITAMINB12, FOLATE, FERRITIN, TIBC, IRON, RETICCTPCT in the last 72 hours. Urinalysis    Component Value Date/Time   COLORURINE YELLOW 09/14/2021 2135   APPEARANCEUR CLEAR 09/14/2021 2135   LABSPEC 1.026 09/14/2021 2135   PHURINE 7.0 09/14/2021 2135   GLUCOSEU >=500 (A) 09/14/2021 2135   HGBUR  MODERATE (A) 09/14/2021 2135   BILIRUBINUR NEGATIVE 09/14/2021 2135   KETONESUR 20 (A) 09/14/2021 2135   PROTEINUR 100 (A) 09/14/2021 2135   NITRITE NEGATIVE 09/14/2021 2135   LEUKOCYTESUR NEGATIVE 09/14/2021 2135   Sepsis Labs Invalid input(s): PROCALCITONIN,  WBC,  LACTICIDVEN Microbiology Recent Results (from the past 240 hour(s))  Resp Panel by RT-PCR (Flu A&B, Covid) Nasopharyngeal Swab     Status: None   Collection Time: 12/23/21  9:37 AM   Specimen: Nasopharyngeal Swab; Nasopharyngeal(NP) swabs in vial transport medium  Result Value Ref Range Status   SARS Coronavirus 2 by RT PCR NEGATIVE NEGATIVE Final    Comment: (NOTE) SARS-CoV-2 target nucleic acids are NOT DETECTED.  The SARS-CoV-2 RNA is generally detectable in upper respiratory specimens during the acute phase of infection. The lowest concentration of SARS-CoV-2 viral copies this  assay can detect is 138 copies/mL. A negative result does not preclude SARS-Cov-2 infection and should not be used as the sole basis for treatment or other patient management decisions. A negative result may occur with  improper specimen collection/handling, submission of specimen other than nasopharyngeal swab, presence of viral mutation(s) within the areas targeted by this assay, and inadequate number of viral copies(<138 copies/mL). A negative result must be combined with clinical observations, patient history, and epidemiological information. The expected result is Negative.  Fact Sheet for Patients:  BloggerCourse.com  Fact Sheet for Healthcare Providers:  SeriousBroker.it  This test is no t yet approved or cleared by the [place] FDA and  has been authorized for detection and/or diagnosis of SARS-CoV-2 by FDA under an Emergency Use Authorization (EUA). This EUA will remain  in effect (meaning this test can be used) for the duration of the COVID-19 declaration under Section 564(b)(1) of the Act, 21 U.S.C.section 360bbb-3(b)(1), unless the authorization is terminated  or revoked sooner.       Influenza A by PCR NEGATIVE NEGATIVE Final   Influenza B by PCR NEGATIVE NEGATIVE Final    Comment: (NOTE) The Xpert Xpress SARS-CoV-2/FLU/RSV plus assay is intended as an aid in the diagnosis of influenza from Nasopharyngeal swab specimens and should not be used as a sole basis for treatment. Nasal washings and aspirates are unacceptable for Xpert Xpress SARS-CoV-2/FLU/RSV testing.  Fact Sheet for Patients: BloggerCourse.com  Fact Sheet for Healthcare Providers: SeriousBroker.it  This test is not yet approved or cleared by the [place] FDA and has been authorized for detection and/or diagnosis of SARS-CoV-2 by FDA under an Emergency Use Authorization (EUA). This EUA will  remain in effect (meaning this test can be used) for the duration of the COVID-19 declaration under Section 564(b)(1) of the Act, 21 U.S.C. section 360bbb-3(b)(1), unless the authorization is terminated or revoked.  Performed at [facility], [address] [place], [place] [address]   MRSA Next Gen by PCR, Nasal     Status: None   Collection Time: 12/23/21  3:18 PM   Specimen: Nasal Mucosa; Nasal Swab  Result Value Ref Range Status   MRSA by PCR Next Gen NOT DETECTED NOT DETECTED Final    Comment: (NOTE) The GeneXpert MRSA Assay (FDA approved for NASAL specimens only), is one component of a comprehensive MRSA colonization surveillance program. It is not intended to diagnose MRSA infection nor to guide or monitor treatment for MRSA infections. Test performance is not FDA approved in patients less than 103 years old. Performed at [facility], [address], [place], [place] [address]  Time coordinating discharge: Over 30 minutes  SIGNED:   Cipriano Bunker, MD  [facility] 12/25/2021, 2:37 PM Pager   If 7PM-7AM, please contact night-coverage

## 2021-12-31 ENCOUNTER — Other Ambulatory Visit (HOSPITAL_COMMUNITY): Payer: Self-pay

## 2021-12-31 MED ORDER — EMPAGLIFLOZIN 10 MG PO TABS: 10.0000 mg | ORAL_TABLET | Freq: Every day | ORAL | 0 refills | Status: AC

## 2022-01-02 ENCOUNTER — Inpatient Hospital Stay: Payer: Medicare HMO | Admitting: Family Medicine

## 2022-01-09 ENCOUNTER — Encounter (HOSPITAL_COMMUNITY): Payer: Self-pay

## 2022-01-09 ENCOUNTER — Other Ambulatory Visit: Payer: Self-pay

## 2022-01-09 ENCOUNTER — Emergency Department (HOSPITAL_COMMUNITY)
Admission: EM | Admit: 2022-01-09 | Discharge: 2022-01-09 | Disposition: A | Discharge status: Home / Self Care | Payer: Medicare HMO | Attending: Emergency Medicine | Admitting: Emergency Medicine

## 2022-01-09 DIAGNOSIS — R112 Nausea with vomiting, unspecified: Secondary | ICD-10-CM | POA: Diagnosis not present

## 2022-01-09 DIAGNOSIS — R1084 Generalized abdominal pain: Secondary | ICD-10-CM | POA: Insufficient documentation

## 2022-01-09 DIAGNOSIS — D72829 Elevated white blood cell count, unspecified: Secondary | ICD-10-CM | POA: Diagnosis not present

## 2022-01-09 DIAGNOSIS — Z20822 Contact with and (suspected) exposure to covid-19: Secondary | ICD-10-CM | POA: Diagnosis not present

## 2022-01-09 LAB — CBC WITH DIFFERENTIAL/PLATELET
Abs Immature Granulocytes: 0.03 10*3/uL (ref 0.00–0.07)
Basophils Absolute: 0 10*3/uL (ref 0.0–0.1)
Basophils Relative: 0 %
Eosinophils Absolute: 0 10*3/uL (ref 0.0–0.5)
Eosinophils Relative: 0 %
HCT: 36.4 % — ABNORMAL LOW (ref 39.0–52.0)
Hemoglobin: 11.9 g/dL — ABNORMAL LOW (ref 13.0–17.0)
Immature Granulocytes: 0 %
Lymphocytes Relative: 9 %
Lymphs Abs: 0.7 10*3/uL (ref 0.7–4.0)
MCH: 30.5 pg (ref 26.0–34.0)
MCHC: 32.7 g/dL (ref 30.0–36.0)
MCV: 93.3 fL (ref 80.0–100.0)
Monocytes Absolute: 0.4 10*3/uL (ref 0.1–1.0)
Monocytes Relative: 6 %
Neutro Abs: 6.5 10*3/uL (ref 1.7–7.7)
Neutrophils Relative %: 85 %
Platelets: 308 10*3/uL (ref 150–400)
RBC: 3.9 MIL/uL — ABNORMAL LOW (ref 4.22–5.81)
RDW: 13.7 % (ref 11.5–15.5)
WBC: 7.6 10*3/uL (ref 4.0–10.5)
nRBC: 0 % (ref 0.0–0.2)

## 2022-01-09 LAB — COMPREHENSIVE METABOLIC PANEL
ALT: 18 U/L (ref 0–44)
AST: 28 U/L (ref 15–41)
Albumin: 4.4 g/dL (ref 3.5–5.0)
Alkaline Phosphatase: 105 U/L (ref 38–126)
Anion gap: 11 (ref 5–15)
BUN: 16 mg/dL (ref 8–23)
CO2: 21 mmol/L — ABNORMAL LOW (ref 22–32)
Calcium: 9.3 mg/dL (ref 8.9–10.3)
Chloride: 100 mmol/L (ref 98–111)
Creatinine, Ser: 1.02 mg/dL (ref 0.61–1.24)
GFR, Estimated: >60 mL/min (ref >60)
Glucose, Bld: 303 mg/dL — ABNORMAL HIGH (ref 70–99)
Potassium: 4.2 mmol/L (ref 3.5–5.1)
Sodium: 132 mmol/L — ABNORMAL LOW (ref 135–145)
Total Bilirubin: 1.2 mg/dL (ref 0.3–1.2)
Total Protein: 8.8 g/dL — ABNORMAL HIGH (ref 6.5–8.1)

## 2022-01-09 LAB — RESP PANEL BY RT-PCR (FLU A&B, COVID) ARPGX2
Influenza A by PCR: NEGATIVE
Influenza B by PCR: NEGATIVE
SARS Coronavirus 2 by RT PCR: NEGATIVE

## 2022-01-09 LAB — LIPASE, BLOOD: Lipase: 38 U/L (ref 11–51)

## 2022-01-09 MED ORDER — FAMOTIDINE IN NACL 20-0.9 MG/50ML-% IV SOLN
20.0000 mg | Freq: Once | INTRAVENOUS | Status: AC
Start: 2022-01-09 — End: 2022-01-09
  Administered 2022-01-09: 20 mg via INTRAVENOUS
  Filled 2022-01-09: qty 50

## 2022-01-09 MED ORDER — SODIUM CHLORIDE 0.9 % IV BOLUS
1000.0000 mL | Freq: Once | INTRAVENOUS | Status: AC
  Administered 2022-01-09: 1000 mL via INTRAVENOUS

## 2022-01-09 MED ORDER — ONDANSETRON 4 MG PO TBDP
4.0000 mg | ORAL_TABLET | Freq: Three times a day (TID) | ORAL | 0 refills | Status: AC | PRN
Start: 2022-01-09 — End: ?

## 2022-01-09 MED ORDER — ONDANSETRON 4 MG PO TBDP: 4.0000 mg | ORAL_TABLET | Freq: Three times a day (TID) | ORAL | 0 refills | Status: DC | PRN

## 2022-01-09 MED ORDER — ONDANSETRON HCL 4 MG/2ML IJ SOLN
4.0000 mg | Freq: Once | INTRAMUSCULAR | Status: AC
  Administered 2022-01-09: 4 mg via INTRAVENOUS
  Filled 2022-01-09: qty 2

## 2022-01-09 MED ORDER — ONDANSETRON HCL 4 MG/2ML IJ SOLN
4.0000 mg | Freq: Once | INTRAMUSCULAR | Status: AC
Start: 2022-01-09 — End: 2022-01-09
  Administered 2022-01-09: 4 mg via INTRAVENOUS
  Filled 2022-01-09: qty 2

## 2022-01-09 NOTE — ED Triage Notes (Signed)
Pt reports with vomiting and abdominal pain since yesterday. Pt states that he took Zofran with no relief.

## 2022-01-09 NOTE — ED Provider Notes (Signed)
[facility]-EMERGENCY DEPT [facility] Emergency Department Provider Note MRN:  [id number]  Arrival date & time: 01/09/22     Chief Complaint   Vomiting and Abdominal Pain   History of Present Illness   Micheal Hardin is a 61 y.o. year-old male presents to the ED with chief complaint of nausea and vomiting with generalized abdominal pain.  Onset of symptoms was earlier today.  He denies alcohol use.  Denies fevers or chills.  Denies diarrhea.  States that he has crampy abdominal pain from the vomiting.  History provided by patient.  Review of Systems  Pertinent review of systems noted in HPI.    Physical Exam   Vitals:   01/09/22 0109 01/09/22 0152  BP:  (!) 145/91  Pulse: (!) 118 (!) 115  Resp: 19 16  Temp: 97.7 F (36.5 C)   SpO2: 100% 100%    CONSTITUTIONAL:  well-appearing, NAD NEURO:  Alert and oriented x 3, CN 3-12 grossly intact EYES:  eyes equal and reactive ENT/NECK:  Supple, no stridor  CARDIO:  tachycardic, regular rhythm, appears well-perfused  PULM:  No respiratory distress, CTAB GI/GU:  non-distended, normal bowel sounds, generalized abdominal discomfort MSK/SPINE:  No gross deformities, no edema, moves all extremities  SKIN:  no rash, atraumatic   *Additional and/or pertinent findings included in MDM below  Diagnostic and Interventional Summary    EKG Interpretation  Date/Time:    Ventricular Rate:    PR Interval:    QRS Duration:   QT Interval:    QTC Calculation:   R Axis:     Text Interpretation:         Labs Reviewed  CBC WITH DIFFERENTIAL/PLATELET - Abnormal; Notable for the following components:      Result Value   RBC 3.90 (*)    Hemoglobin 11.9 (*)    HCT 36.4 (*)    All other components within normal limits  COMPREHENSIVE METABOLIC PANEL - Abnormal; Notable for the following components:   Sodium 132 (*)    CO2 21 (*)    Glucose, Bld 303 (*)    Total Protein 8.8 (*)    All other components within normal limits  RESP PANEL  BY RT-PCR (FLU A&B, COVID) [id number]  LIPASE, BLOOD    No orders to display    Medications  sodium chloride 0.9 % bolus 1,000 mL (1,000 mLs Intravenous New Bag/Given 01/09/22 0128)  ondansetron (ZOFRAN) injection 4 mg (4 mg Intravenous Given 01/09/22 0129)  famotidine (PEPCID) IVPB 20 mg premix (0 mg Intravenous Stopped 01/09/22 0220)     Procedures  /  Critical Care Procedures  ED Course and Medical Decision Making  I have reviewed the triage vital signs, the nursing notes, and pertinent available records from the EMR.  Complexity of Problems Addressed: Moderate Complexity: Acute illness with systemic symptoms, requiring diagnostic workup to rule out more severe disease.  ED Course:    After considering the following differential, pancreatitis, gastroenteritis, I ordered zofran, pepcid, and fluids.  I personally interpreted the labs which are notable for reassuring.  Mild signs of dehydration and mild leukoctyosis .  Patient does not have any focal abdominal tenderness, his nontoxic in appearance, treated with fluids and Zofran in the ED.  No longer vomiting.  Will discharge home.  Social Determinants Affecting Care: No clinically significant social determinants affecting this chief complaint.    Consultants: No consultations were needed in caring for this patient.   Treatment and Plan:  Zofran for home.  Return for  worsening symptoms.  Emergency department workup does not suggest an emergent condition requiring admission or immediate intervention beyond  what has been performed at this time. The patient is safe for discharge and has  been instructed to return immediately for worsening symptoms, change in  symptoms or any other concerns    Final Clinical Impressions(s) / ED Diagnoses     ICD-10-CM   1. Nausea and vomiting, unspecified vomiting type  R11.2       ED Discharge Orders          Ordered    ondansetron (ZOFRAN-ODT) 4 MG disintegrating tablet  Every 8 hours  PRN        01/09/22 0302             Discharge Instructions Discussed with and Provided to Patient:   Discharge Instructions   None      Roxy Horseman, PA-C 01/09/22 0304    Wilkie Aye Mayer Masker, MD 01/09/22 0500

## 2022-04-23 ENCOUNTER — Encounter: Payer: Self-pay | Admitting: *Deleted

## 2022-06-25 ENCOUNTER — Other Ambulatory Visit (HOSPITAL_COMMUNITY): Payer: Self-pay

## 2024-03-03 ENCOUNTER — Other Ambulatory Visit: Payer: Self-pay

## 2024-07-28 ENCOUNTER — Telehealth: Payer: Self-pay

## 2024-07-29 NOTE — Telephone Encounter (Signed)
 error
